# Patient Record
Sex: Female | Born: 1935 | Race: White | Hispanic: No | Marital: Married | Smoking: Never smoker
Health system: Southern US, Community
[De-identification: ages and names within clinical notes are randomized; demographics above are authoritative.]

## PROBLEM LIST (undated history)

## (undated) DIAGNOSIS — E7800 Pure hypercholesterolemia, unspecified: Secondary | ICD-10-CM

## (undated) DIAGNOSIS — F329 Major depressive disorder, single episode, unspecified: Secondary | ICD-10-CM

## (undated) DIAGNOSIS — Z8739 Personal history of other diseases of the musculoskeletal system and connective tissue: Secondary | ICD-10-CM

## (undated) DIAGNOSIS — F419 Anxiety disorder, unspecified: Secondary | ICD-10-CM

## (undated) DIAGNOSIS — R569 Unspecified convulsions: Secondary | ICD-10-CM

## (undated) DIAGNOSIS — F32A Depression, unspecified: Secondary | ICD-10-CM

## (undated) DIAGNOSIS — N390 Urinary tract infection, site not specified: Secondary | ICD-10-CM

## (undated) DIAGNOSIS — I4891 Unspecified atrial fibrillation: Secondary | ICD-10-CM

## (undated) HISTORY — PX: OTHER SURGICAL HISTORY: SHX169

---

## 2004-11-06 ENCOUNTER — Ambulatory Visit: Payer: Self-pay | Admitting: Ophthalmology

## 2004-11-12 ENCOUNTER — Ambulatory Visit: Payer: Self-pay | Admitting: Ophthalmology

## 2006-11-05 ENCOUNTER — Ambulatory Visit: Payer: Self-pay | Admitting: Urology

## 2008-03-19 ENCOUNTER — Ambulatory Visit: Payer: Self-pay | Admitting: Internal Medicine

## 2009-08-10 ENCOUNTER — Emergency Department: Payer: Self-pay | Admitting: Emergency Medicine

## 2009-11-07 ENCOUNTER — Ambulatory Visit: Payer: Self-pay | Admitting: Family Medicine

## 2010-03-14 ENCOUNTER — Ambulatory Visit: Payer: Self-pay | Admitting: Internal Medicine

## 2010-12-16 ENCOUNTER — Ambulatory Visit: Payer: Self-pay | Admitting: Ophthalmology

## 2012-05-31 ENCOUNTER — Ambulatory Visit: Payer: Self-pay | Admitting: Family Medicine

## 2012-07-18 ENCOUNTER — Ambulatory Visit: Payer: Self-pay | Admitting: Neurology

## 2014-09-20 ENCOUNTER — Emergency Department: Payer: Self-pay | Admitting: Emergency Medicine

## 2015-05-12 ENCOUNTER — Encounter: Payer: Self-pay | Admitting: Emergency Medicine

## 2015-05-12 ENCOUNTER — Emergency Department
Admission: EM | Admit: 2015-05-12 | Discharge: 2015-05-12 | Disposition: A | Discharge status: Home / Self Care | Payer: Medicare Other | Attending: Emergency Medicine | Admitting: Emergency Medicine

## 2015-05-12 ENCOUNTER — Emergency Department: Payer: Medicare Other

## 2015-05-12 DIAGNOSIS — G8929 Other chronic pain: Secondary | ICD-10-CM | POA: Diagnosis not present

## 2015-05-12 DIAGNOSIS — W1839XA Other fall on same level, initial encounter: Secondary | ICD-10-CM | POA: Insufficient documentation

## 2015-05-12 DIAGNOSIS — S3992XA Unspecified injury of lower back, initial encounter: Secondary | ICD-10-CM | POA: Insufficient documentation

## 2015-05-12 DIAGNOSIS — S0990XA Unspecified injury of head, initial encounter: Secondary | ICD-10-CM

## 2015-05-12 DIAGNOSIS — S0101XA Laceration without foreign body of scalp, initial encounter: Secondary | ICD-10-CM | POA: Diagnosis not present

## 2015-05-12 DIAGNOSIS — Y9389 Activity, other specified: Secondary | ICD-10-CM | POA: Diagnosis not present

## 2015-05-12 DIAGNOSIS — Y92002 Bathroom of unspecified non-institutional (private) residence single-family (private) house as the place of occurrence of the external cause: Secondary | ICD-10-CM | POA: Diagnosis not present

## 2015-05-12 DIAGNOSIS — Y998 Other external cause status: Secondary | ICD-10-CM | POA: Diagnosis not present

## 2015-05-12 HISTORY — DX: Unspecified convulsions: R56.9

## 2015-05-12 MED ORDER — TRAMADOL HCL 50 MG PO TABS: 50.0000 mg | ORAL_TABLET | Freq: Four times a day (QID) | ORAL | Status: DC | PRN

## 2015-05-12 MED ORDER — TRAMADOL HCL 50 MG PO TABS
50.0000 mg | ORAL_TABLET | Freq: Once | ORAL | Status: AC
  Administered 2015-05-12: 50 mg via ORAL
  Filled 2015-05-12: qty 1

## 2015-05-12 MED ORDER — LIDOCAINE-EPINEPHRINE (PF) 1 %-1:200000 IJ SOLN
INTRAMUSCULAR | Status: AC
  Filled 2015-05-12: qty 30

## 2015-05-12 NOTE — ED Notes (Signed)
Pt alert and oriented X4, active, cooperative, pt in NAD. RR even and unlabored, color WNL.  Pt informed to return if any life threatening symptoms occur.   

## 2015-05-12 NOTE — ED Notes (Signed)
Pt fell in last night in bathroom. Pt cannot recall if she had syncopal episode or mechanical fall. Laceration with bleeding controlled to top right forehead. Pt also c/o left sided rib pain.

## 2015-05-12 NOTE — ED Notes (Signed)
Pt to ED after falling during the night and hitting right side of head, family member states pt fell in the bathroom and hit head on counter, unknown if any LOC, pt has laceration present to right side of head with bleeding controlled

## 2015-05-12 NOTE — ED Provider Notes (Signed)
[facility] Emergency Department Provider Note     Time seen: ----------------------------------------- 5:00 PM on 05/12/2015 -----------------------------------------    I have reviewed the triage vital signs and the nursing notes.   HISTORY  Chief Complaint Fall and Head Injury    HPI Sherry Crosby is a 81 y.o. female who presents to ER after she fell last night in the bathroom. She cannot recall she had a syncopal episode or mechanical fall. She had a laceration above her right eyebrow, was complaining of some chronic diffuse whole-body aches area does not feel like that the fall worsened her chronic chest and back pain. Patient denies any other complaints this time.   Past Medical History  Diagnosis Date  . Seizures (HCC)     There are no active problems to display for this patient.   No past surgical history on file.  Allergies Review of patient's allergies indicates no known allergies.  Social History Social History  Substance Use Topics  . Smoking status: Never Smoker   . Smokeless tobacco: None  . Alcohol Use: No    Review of Systems Constitutional: Negative for fever. Eyes: Negative for visual changes. ENT: Negative for sore throat. Cardiovascular: Negative for chest pain. Respiratory: Negative for shortness of breath. Gastrointestinal: Negative for abdominal pain, vomiting and diarrhea. Genitourinary: Negative for dysuria. Musculoskeletal: Positive for chronic back pain Skin: Positive for laceration Neurological: Positive for headache  10-point ROS otherwise negative.  ____________________________________________   PHYSICAL EXAM:  VITAL SIGNS: ED Triage Vitals  Enc Vitals Group     BP 05/12/15 1646 146/65 mmHg     Pulse Rate 05/12/15 1646 68     Resp 05/12/15 1646 18     Temp 05/12/15 1646 98 F (36.7 C)     Temp Source 05/12/15 1646 Oral     SpO2 05/12/15 1646 100 %     Weight 05/12/15 1646 88 lb (39.917  kg)     Height 05/12/15 1646 5\' 3"  (1.6 m)     Head Cir --      Peak Flow --      Pain Score --      Pain Loc --      Pain Edu? --      Excl. in GC? --     Constitutional: Alert and oriented. Well appearing and in no distress. Eyes: Conjunctivae are normal. PERRL. Normal extraocular movements. ENT   Head: Normocephalic, 3 cm laceration as noted above the right eyebrow that is linear, into the subcutaneous tissue   Nose: No congestion/rhinnorhea.   Mouth/Throat: Mucous membranes are moist.   Neck: No stridor. Cardiovascular: Normal rate, regular rhythm. Normal and symmetric distal pulses are present in all extremities. No murmurs, rubs, or gallops. Respiratory: Normal respiratory effort without tachypnea nor retractions. Breath sounds are clear and equal bilaterally. No wheezes/rales/rhonchi. Musculoskeletal: Nontender with normal range of motion in all extremities. No joint effusions.  No lower extremity tenderness nor edema. Neurologic:  Normal speech and language. No gross focal neurologic deficits are appreciated. Speech is normal.  Skin:  3 cm laceration as noted above the right eyebrow.  ___________________________________________  ED COURSE:  Pertinent labs & imaging results that were available during my care of the patient were reviewed by me and considered in my medical decision making (see chart for details). Patient is in no acute distress, will need suture repair and CT imaging. ____________________________________________   LACERATION REPAIR Performed by: Emily FilbertWilliams, Ivianna Notch E Authorized by: Daryel NovemberWilliams, Canaan Prue E Consent:  Verbal consent obtained. Risks and benefits: risks, benefits and alternatives were discussed Consent given by: patient Patient identity confirmed: provided demographic data Prepped and Draped in normal sterile fashion Wound explored  Laceration Location: Right frontal scalp above the eyebrow  Laceration Length: 3 cm  No Foreign  Bodies seen or palpated  Anesthesia: local infiltration  Local anesthetic: lidocaine 1 % with epinephrine  Anesthetic total: 3 ml  Irrigation method: syringe Amount of cleaning: standard  Skin closure: 6-0 Prolene   Number of sutures: 8   Technique: Running   Patient tolerance: Patient tolerated the procedure well with no immediate complications.  RADIOLOGY Images were viewed by me  CT head is unremarkable  ____________________________________________  FINAL ASSESSMENT AND PLAN  Fall, head injury, scalp laceration  Plan: Patient with labs and imaging as dictated above. Wound was repaired with good cosmetic result. Advise suture removal in 7 days. She stable for outpatient follow-up.   Emily Filbert, MD   Emily Filbert, MD 05/12/15 [phone]

## 2015-05-12 NOTE — Discharge Instructions (Signed)
Head Injury, Adult °You have a head injury. Headaches and throwing up (vomiting) are common after a head injury. It should be easy to wake up from sleeping. Sometimes you must stay in the hospital. Most problems happen within the first 24 hours. Side effects may occur up to 7-10 days after the injury.  °WHAT ARE THE TYPES OF HEAD INJURIES? °Head injuries can be as minor as a bump. Some head injuries can be more severe. More severe head injuries include: °· A jarring injury to the brain (concussion). °· A bruise of the brain (contusion). This mean there is bleeding in the brain that can cause swelling. °· A cracked skull (skull fracture). °· Bleeding in the brain that collects, clots, and forms a bump (hematoma). °WHEN SHOULD I GET HELP RIGHT AWAY?  °· You are confused or sleepy. °· You cannot be woken up. °· You feel sick to your stomach (nauseous) or keep throwing up (vomiting). °· Your dizziness or unsteadiness is getting worse. °· You have very bad, lasting headaches that are not helped by medicine. Take medicines only as told by your doctor. °· You cannot use your arms or legs like normal. °· You cannot walk. °· You notice changes in the black spots in the center of the colored part of your eye (pupil). °· You have clear or bloody fluid coming from your nose or ears. °· You have trouble seeing. °During the next 24 hours after the injury, you must stay with someone who can watch you. This person should get help right away (call 911 in the [place] if you start to shake and are not able to control it (have seizures), you pass out, or you are unable to wake up. °HOW CAN I PREVENT A HEAD INJURY IN THE FUTURE? °· Wear seat belts. °· Wear a helmet while bike riding and playing sports like football. °· Stay away from dangerous activities around the house. °WHEN CAN I RETURN TO NORMAL ACTIVITIES AND ATHLETICS? °See your doctor before doing these activities. You should not do normal activities or play contact sports until 1  week after the following symptoms have stopped: °· Headache that does not go away. °· Dizziness. °· Poor attention. °· Confusion. °· Memory problems. °· Sickness to your stomach or throwing up. °· Tiredness. °· Fussiness. °· Bothered by bright lights or loud noises. °· Anxiousness or depression. °· Restless sleep. °MAKE SURE YOU:  °· Understand these instructions. °· Will watch your condition. °· Will get help right away if you are not doing well or get worse. °  °This information is not intended to replace advice given to you by your health care provider. Make sure you discuss any questions you have with your health care provider. °  °Document Released: 05/28/2008 Document Revised: 07/06/2014 Document Reviewed: 02/20/2013 °Elsevier Interactive Patient Education ©2016 [employer] ° °Laceration Care, Adult °A laceration is a cut that goes through all of the layers of the skin and into the tissue that is right under the skin. Some lacerations heal on their own. Others need to be closed with stitches (sutures), staples, skin adhesive strips, or skin glue. Proper laceration care minimizes the risk of infection and helps the laceration to heal better. °HOW TO CARE FOR YOUR LACERATION °If sutures or staples were used: °· Keep the wound clean and dry. °· If you were given a bandage (dressing), you should change it at least one time per day or as told by your health care provider. You should also   change it if it becomes wet or dirty. °· Keep the wound completely dry for the first 24 hours or as told by your health care provider. After that time, you may shower or bathe. However, make sure that the wound is not soaked in water until after the sutures or staples have been removed. °· Clean the wound one time each day or as told by your health care provider: °¨ Wash the wound with soap and water. °¨ Rinse the wound with water to remove all soap. °¨ Pat the wound dry with a clean towel. Do not rub the wound. °· After cleaning  the wound, apply a thin layer of antibiotic ointment as told by your health care provider. This will help to prevent infection and keep the dressing from sticking to the wound. °· Have the sutures or staples removed as told by your health care provider. °If skin adhesive strips were used: °· Keep the wound clean and dry. °· If you were given a bandage (dressing), you should change it at least one time per day or as told by your health care provider. You should also change it if it becomes dirty or wet. °· Do not get the skin adhesive strips wet. You may shower or bathe, but be careful to keep the wound dry. °· If the wound gets wet, pat it dry with a clean towel. Do not rub the wound. °· Skin adhesive strips fall off on their own. You may trim the strips as the wound heals. Do not remove skin adhesive strips that are still stuck to the wound. They will fall off in time. °If skin glue was used: °· Try to keep the wound dry, but you may briefly wet it in the shower or bath. Do not soak the wound in water, such as by swimming. °· After you have showered or bathed, gently pat the wound dry with a clean towel. Do not rub the wound. °· Do not do any activities that will make you sweat heavily until the skin glue has fallen off on its own. °· Do not apply liquid, cream, or ointment medicine to the wound while the skin glue is in place. Using those may loosen the film before the wound has healed. °· If you were given a bandage (dressing), you should change it at least one time per day or as told by your health care provider. You should also change it if it becomes dirty or wet. °· If a dressing is placed over the wound, be careful not to apply tape directly over the skin glue. Doing that may cause the glue to be pulled off before the wound has healed. °· Do not pick at the glue. The skin glue usually remains in place for 5-10 days, then it falls off of the skin. °General Instructions °· Take over-the-counter and  prescription medicines only as told by your health care provider. °· If you were prescribed an antibiotic medicine or ointment, take or apply it as told by your doctor. Do not stop using it even if your condition improves. °· To help prevent scarring, make sure to cover your wound with sunscreen whenever you are outside after stitches are removed, after adhesive strips are removed, or when glue remains in place and the wound is healed. Make sure to wear a sunscreen of at least 30 SPF. °· Do not scratch or pick at the wound. °· Keep all follow-up visits as told by your health care provider. This is important. °·   Check your wound every day for signs of infection. Watch for: °¨ Redness, swelling, or pain. °¨ Fluid, blood, or pus. °· Raise (elevate) the injured area above the level of your heart while you are sitting or lying down, if possible. °SEEK MEDICAL CARE IF: °· You received a tetanus shot and you have swelling, severe pain, redness, or bleeding at the injection site. °· You have a fever. °· A wound that was closed breaks open. °· You notice a bad smell coming from your wound or your dressing. °· You notice something coming out of the wound, such as wood or glass. °· Your pain is not controlled with medicine. °· You have increased redness, swelling, or pain at the site of your wound. °· You have fluid, blood, or pus coming from your wound. °· You notice a change in the color of your skin near your wound. °· You need to change the dressing frequently due to fluid, blood, or pus draining from the wound. °· You develop a new rash. °· You develop numbness around the wound. °SEEK IMMEDIATE MEDICAL CARE IF: °· You develop severe swelling around the wound. °· Your pain suddenly increases and is severe. °· You develop painful lumps near the wound or on skin that is anywhere on your body. °· You have a red streak going away from your wound. °· The wound is on your hand or foot and you cannot properly move a finger or  toe. °· The wound is on your hand or foot and you notice that your fingers or toes look pale or bluish. °  °This information is not intended to replace advice given to you by your health care provider. Make sure you discuss any questions you have with your health care provider. °  °Document Released: 06/15/2005 Document Revised: 10/30/2014 Document Reviewed: 06/11/2014 °Elsevier Interactive Patient Education ©2016 [employer] ° °

## 2015-07-30 ENCOUNTER — Emergency Department
Admission: EM | Admit: 2015-07-30 | Discharge: 2015-07-30 | Disposition: A | Discharge status: Home / Self Care | Payer: Medicare Other | Attending: Emergency Medicine | Admitting: Emergency Medicine

## 2015-07-30 ENCOUNTER — Encounter: Payer: Self-pay | Admitting: Emergency Medicine

## 2015-07-30 ENCOUNTER — Emergency Department: Payer: Medicare Other

## 2015-07-30 DIAGNOSIS — S0181XA Laceration without foreign body of other part of head, initial encounter: Secondary | ICD-10-CM | POA: Insufficient documentation

## 2015-07-30 DIAGNOSIS — S0101XA Laceration without foreign body of scalp, initial encounter: Secondary | ICD-10-CM | POA: Insufficient documentation

## 2015-07-30 DIAGNOSIS — W19XXXA Unspecified fall, initial encounter: Secondary | ICD-10-CM

## 2015-07-30 DIAGNOSIS — Y9389 Activity, other specified: Secondary | ICD-10-CM | POA: Diagnosis not present

## 2015-07-30 DIAGNOSIS — W1839XA Other fall on same level, initial encounter: Secondary | ICD-10-CM | POA: Diagnosis not present

## 2015-07-30 DIAGNOSIS — Y92009 Unspecified place in unspecified non-institutional (private) residence as the place of occurrence of the external cause: Secondary | ICD-10-CM | POA: Diagnosis not present

## 2015-07-30 DIAGNOSIS — Y998 Other external cause status: Secondary | ICD-10-CM | POA: Diagnosis not present

## 2015-07-30 DIAGNOSIS — R2689 Other abnormalities of gait and mobility: Secondary | ICD-10-CM | POA: Insufficient documentation

## 2015-07-30 DIAGNOSIS — S01111A Laceration without foreign body of right eyelid and periocular area, initial encounter: Secondary | ICD-10-CM | POA: Insufficient documentation

## 2015-07-30 DIAGNOSIS — S0990XA Unspecified injury of head, initial encounter: Secondary | ICD-10-CM | POA: Diagnosis present

## 2015-07-30 MED ORDER — LIDOCAINE-EPINEPHRINE (PF) 1 %-1:200000 IJ SOLN
INTRAMUSCULAR | Status: AC
  Filled 2015-07-30: qty 30

## 2015-07-30 MED ORDER — ACETAMINOPHEN 500 MG PO TABS
1000.0000 mg | ORAL_TABLET | Freq: Once | ORAL | Status: AC
  Administered 2015-07-30: 1000 mg via ORAL
  Filled 2015-07-30: qty 2

## 2015-07-30 NOTE — ED Notes (Signed)
Pt to ed with c/o fall this am in the bathroom at home.  Pt with hematoma and laceration to right forehead.  Near eyebrow.  Pt alert an oriented x 4.  Pt confused about fall and can not verify if she had a loss of consciousness.

## 2015-07-30 NOTE — Discharge Instructions (Signed)
Head Injury, Adult °You have a head injury. Headaches and throwing up (vomiting) are common after a head injury. It should be easy to wake up from sleeping. Sometimes you must stay in the hospital. Most problems happen within the first 24 hours. Side effects may occur up to 7-10 days after the injury.  °WHAT ARE THE TYPES OF HEAD INJURIES? °Head injuries can be as minor as a bump. Some head injuries can be more severe. More severe head injuries include: °· A jarring injury to the brain (concussion). °· A bruise of the brain (contusion). This mean there is bleeding in the brain that can cause swelling. °· A cracked skull (skull fracture). °· Bleeding in the brain that collects, clots, and forms a bump (hematoma). °WHEN SHOULD I GET HELP RIGHT AWAY?  °· You are confused or sleepy. °· You cannot be woken up. °· You feel sick to your stomach (nauseous) or keep throwing up (vomiting). °· Your dizziness or unsteadiness is getting worse. °· You have very bad, lasting headaches that are not helped by medicine. Take medicines only as told by your doctor. °· You cannot use your arms or legs like normal. °· You cannot walk. °· You notice changes in the black spots in the center of the colored part of your eye (pupil). °· You have clear or bloody fluid coming from your nose or ears. °· You have trouble seeing. °During the next 24 hours after the injury, you must stay with someone who can watch you. This person should get help right away (call 911 in the [place] if you start to shake and are not able to control it (have seizures), you pass out, or you are unable to wake up. °HOW CAN I PREVENT A HEAD INJURY IN THE FUTURE? °· Wear seat belts. °· Wear a helmet while bike riding and playing sports like football. °· Stay away from dangerous activities around the house. °WHEN CAN I RETURN TO NORMAL ACTIVITIES AND ATHLETICS? °See your doctor before doing these activities. You should not do normal activities or play contact sports until 1  week after the following symptoms have stopped: °· Headache that does not go away. °· Dizziness. °· Poor attention. °· Confusion. °· Memory problems. °· Sickness to your stomach or throwing up. °· Tiredness. °· Fussiness. °· Bothered by bright lights or loud noises. °· Anxiousness or depression. °· Restless sleep. °MAKE SURE YOU:  °· Understand these instructions. °· Will watch your condition. °· Will get help right away if you are not doing well or get worse. °  °This information is not intended to replace advice given to you by your health care provider. Make sure you discuss any questions you have with your health care provider. °  °Document Released: 05/28/2008 Document Revised: 07/06/2014 Document Reviewed: 02/20/2013 °Elsevier Interactive Patient Education ©2016 [employer] ° °Laceration Care, Adult °A laceration is a cut that goes through all layers of the skin. The cut also goes into the tissue that is right under the skin. Some cuts heal on their own. Others need to be closed with stitches (sutures), staples, skin adhesive strips, or wound glue. Taking care of your cut lowers your risk of infection and helps your cut to heal better. °HOW TO TAKE CARE OF YOUR CUT °For stitches or staples: °· Keep the wound clean and dry. °· If you were given a bandage (dressing), you should change it at least one time per day or as told by your doctor. You should also   change it if it gets wet or dirty. °· Keep the wound completely dry for the first 24 hours or as told by your doctor. After that time, you may take a shower or a bath. However, make sure that the wound is not soaked in water until after the stitches or staples have been removed. °· Clean the wound one time each day or as told by your doctor: °¨ Wash the wound with soap and water. °¨ Rinse the wound with water until all of the soap comes off. °¨ Pat the wound dry with a clean towel. Do not rub the wound. °· After you clean the wound, put a thin layer of  antibiotic ointment on it as told by your doctor. This ointment: °¨ Helps to prevent infection. °¨ Keeps the bandage from sticking to the wound. °· Have your stitches or staples removed as told by your doctor. °If your doctor used skin adhesive strips:  °· Keep the wound clean and dry. °· If you were given a bandage, you should change it at least one time per day or as told by your doctor. You should also change it if it gets dirty or wet. °· Do not get the skin adhesive strips wet. You can take a shower or a bath, but be careful to keep the wound dry. °· If the wound gets wet, pat it dry with a clean towel. Do not rub the wound. °· Skin adhesive strips fall off on their own. You can trim the strips as the wound heals. Do not remove any strips that are still stuck to the wound. They will fall off after a while. °If your doctor used wound glue: °· Try to keep your wound dry, but you may briefly wet it in the shower or bath. Do not soak the wound in water, such as by swimming. °· After you take a shower or a bath, gently pat the wound dry with a clean towel. Do not rub the wound. °· Do not do any activities that will make you really sweaty until the skin glue has fallen off on its own. °· Do not apply liquid, cream, or ointment medicine to your wound while the skin glue is still on. °· If you were given a bandage, you should change it at least one time per day or as told by your doctor. You should also change it if it gets dirty or wet. °· If a bandage is placed over the wound, do not let the tape for the bandage touch the skin glue. °· Do not pick at the glue. The skin glue usually stays on for 5-10 days. Then, it falls off of the skin. °General Instructions  °· To help prevent scarring, make sure to cover your wound with sunscreen whenever you are outside after stitches are removed, after adhesive strips are removed, or when wound glue stays in place and the wound is healed. Make sure to wear a sunscreen of at least  30 SPF. °· Take over-the-counter and prescription medicines only as told by your doctor. °· If you were given antibiotic medicine or ointment, take or apply it as told by your doctor. Do not stop using the antibiotic even if your wound is getting better. °· Do not scratch or pick at the wound. °· Keep all follow-up visits as told by your doctor. This is important. °· Check your wound every day for signs of infection. Watch for: °¨ Redness, swelling, or pain. °¨ Fluid, blood, or pus. °·   Raise (elevate) the injured area above the level of your heart while you are sitting or lying down, if possible. °GET HELP IF: °· You got a tetanus shot and you have any of these problems at the injection site: °¨ Swelling. °¨ Very bad pain. °¨ Redness. °¨ Bleeding. °· You have a fever. °· A wound that was closed breaks open. °· You notice a bad smell coming from your wound or your bandage. °· You notice something coming out of the wound, such as wood or glass. °· Medicine does not help your pain. °· You have more redness, swelling, or pain at the site of your wound. °· You have fluid, blood, or pus coming from your wound. °· You notice a change in the color of your skin near your wound. °· You need to change the bandage often because fluid, blood, or pus is coming from the wound. °· You start to have a new rash. °· You start to have numbness around the wound. °GET HELP RIGHT AWAY IF: °· You have very bad swelling around the wound. °· Your pain suddenly gets worse and is very bad. °· You notice painful lumps near the wound or on skin that is anywhere on your body. °· You have a red streak going away from your wound. °· The wound is on your hand or foot and you cannot move a finger or toe like you usually can. °· The wound is on your hand or foot and you notice that your fingers or toes look pale or bluish. °  °This information is not intended to replace advice given to you by your health care provider. Make sure you discuss any  questions you have with your health care provider. °  °Document Released: 12/02/2007 Document Revised: 10/30/2014 Document Reviewed: 06/11/2014 °Elsevier Interactive Patient Education ©2016 [employer] ° °

## 2015-07-30 NOTE — ED Provider Notes (Signed)
[facility] Emergency Department Provider Note     Time seen: ----------------------------------------- 9:11 AM on 07/30/2015 -----------------------------------------    I have reviewed the triage vital signs and the nursing notes.   HISTORY  Chief Complaint Fall and Head Injury    HPI Sherry Crosby is a 82 y.o. female who presents ER after a fall in the bathroom at home. Patient has an history of falls and has been unsteady on her feet particularly when she wakes up in the morning. This is not a new problem for her, was noted to have a hematoma and laceration right forehead. She describes pain around this area otherwise denies complaints.   Past Medical History  Diagnosis Date  . Seizures (HCC)     There are no active problems to display for this patient.   History reviewed. No pertinent past surgical history.  Allergies Review of patient's allergies indicates no known allergies.  Social History Social History  Substance Use Topics  . Smoking status: Never Smoker   . Smokeless tobacco: None  . Alcohol Use: No    Review of Systems Constitutional: Negative for fever. Eyes: Negative for visual changes. ENT: Negative for sore throat. Cardiovascular: Negative for chest pain. Respiratory: Negative for shortness of breath. Gastrointestinal: Negative for abdominal pain, vomiting and diarrhea. Genitourinary: Negative for dysuria. Musculoskeletal: Negative for back pain. Skin: Negative for rash. Neurological: Positive for headache, chronic gait disturbance  10-point ROS otherwise negative.  ____________________________________________   PHYSICAL EXAM:  VITAL SIGNS: ED Triage Vitals  Enc Vitals Group     BP --      Pulse --      Resp --      Temp --      Temp src --      SpO2 --      Weight 07/30/15 0845 88 lb (39.917 kg)     Height --      Head Cir --      Peak Flow --      Pain Score 07/30/15 0845 4     Pain Loc --    Pain Edu? --      Excl. in GC? --     Constitutional: Alert and oriented. Well appearing and in no distress. Eyes: Conjunctivae are normal. PERRL. Normal extraocular movements. ENT   Head: 3 cm laceration noted over the right periorbital aspect laterally   Nose: No congestion/rhinnorhea.   Mouth/Throat: Mucous membranes are moist.   Neck: No stridor. Cardiovascular: Normal rate, regular rhythm. Normal and symmetric distal pulses are present in all extremities. No murmurs, rubs, or gallops. Respiratory: Normal respiratory effort without tachypnea nor retractions. Breath sounds are clear and equal bilaterally. No wheezes/rales/rhonchi. Gastrointestinal: Soft and nontender. No distention. No abdominal bruits.  Musculoskeletal: Nontender with normal range of motion in all extremities. No joint effusions.  No lower extremity tenderness nor edema. Neurologic:  Normal speech and language. No gross focal neurologic deficits are appreciated. Speech is normal. No gait instability. Skin:  Skin is warm, dry and intact. No rash noted. Psychiatric: Mood and affect are normal. Speech and behavior are normal. Patient exhibits appropriate insight and judgment. ____________________________________________  EKG: Interpreted by me. Sinus rhythm with a rate of 74 bpm, normal PR interval, normal QRS, long QT interval. Normal axis, flat ST and T-wave segments throughout.  ____________________________________________  ED COURSE:  Pertinent labs & imaging results that were available during my care of the patient were reviewed by me and considered in my medical  decision making (see chart for details).  LACERATION REPAIR Performed by: Emily Filbert Authorized by: Daryel November E Consent: Verbal consent obtained. Risks and benefits: risks, benefits and alternatives were discussed Consent given by: patient Patient identity confirmed: provided demographic data Prepped and Draped in  normal sterile fashion Wound explored  Laceration Location: Right periorbital area  Laceration Length: 3 cm  No Foreign Bodies seen or palpated  Anesthesia: local infiltration  Local anesthetic: lidocaine 1 % with epinephrine  Anesthetic total: 4 ml  Irrigation method: syringe Amount of cleaning: standard  Skin closure: 5-0 Prolene   Number of sutures: 7   Technique: Running   Patient tolerance: Patient tolerated the procedure well with no immediate complications. ____________________________________________   RADIOLOGY  CT head IMPRESSION: Atrophy with small vessel chronic ischemic changes of deep cerebral white matter.  No acute intracranial abnormalities. ____________________________________________  FINAL ASSESSMENT AND PLAN  Fall, head injury, scalp laceration  Plan: Patient with labs and imaging as dictated above. Wound was repaired as dictated above with good cosmetic and hemostatic closure. She is in no acute distress, he is ambulating here without any further complaints. She is stable for outpatient follow-up.   Emily Filbert, MD   Emily Filbert, MD 07/30/15 [phone]

## 2015-07-30 NOTE — ED Notes (Signed)
Patient given instructions on using her walker, how to stand and sit to prevent falls, removing any rugs in the home and picking feet up when walking and taking time when moving.  Patient verbalized understanding.

## 2016-03-10 ENCOUNTER — Emergency Department: Payer: Medicare Other

## 2016-03-10 ENCOUNTER — Inpatient Hospital Stay
Admission: EM | Admit: 2016-03-10 | Discharge: 2016-03-20 | DRG: 308 | Disposition: A | Discharge status: Skilled Nursing Facility | Payer: Medicare Other | Attending: Internal Medicine | Admitting: Internal Medicine

## 2016-03-10 ENCOUNTER — Encounter: Payer: Self-pay | Admitting: Emergency Medicine

## 2016-03-10 DIAGNOSIS — Z823 Family history of stroke: Secondary | ICD-10-CM | POA: Diagnosis not present

## 2016-03-10 DIAGNOSIS — G40909 Epilepsy, unspecified, not intractable, without status epilepticus: Secondary | ICD-10-CM | POA: Diagnosis present

## 2016-03-10 DIAGNOSIS — R251 Tremor, unspecified: Secondary | ICD-10-CM | POA: Diagnosis present

## 2016-03-10 DIAGNOSIS — E860 Dehydration: Secondary | ICD-10-CM | POA: Diagnosis present

## 2016-03-10 DIAGNOSIS — D696 Thrombocytopenia, unspecified: Secondary | ICD-10-CM | POA: Diagnosis present

## 2016-03-10 DIAGNOSIS — I480 Paroxysmal atrial fibrillation: Secondary | ICD-10-CM | POA: Diagnosis not present

## 2016-03-10 DIAGNOSIS — N898 Other specified noninflammatory disorders of vagina: Secondary | ICD-10-CM | POA: Diagnosis present

## 2016-03-10 DIAGNOSIS — M85879 Other specified disorders of bone density and structure, unspecified ankle and foot: Secondary | ICD-10-CM | POA: Diagnosis present

## 2016-03-10 DIAGNOSIS — R0902 Hypoxemia: Secondary | ICD-10-CM | POA: Diagnosis present

## 2016-03-10 DIAGNOSIS — F419 Anxiety disorder, unspecified: Secondary | ICD-10-CM | POA: Diagnosis present

## 2016-03-10 DIAGNOSIS — I495 Sick sinus syndrome: Secondary | ICD-10-CM | POA: Diagnosis present

## 2016-03-10 DIAGNOSIS — Z79899 Other long term (current) drug therapy: Secondary | ICD-10-CM

## 2016-03-10 DIAGNOSIS — I5031 Acute diastolic (congestive) heart failure: Secondary | ICD-10-CM | POA: Diagnosis present

## 2016-03-10 DIAGNOSIS — Z87440 Personal history of urinary (tract) infections: Secondary | ICD-10-CM | POA: Diagnosis not present

## 2016-03-10 DIAGNOSIS — Z7982 Long term (current) use of aspirin: Secondary | ICD-10-CM | POA: Diagnosis not present

## 2016-03-10 DIAGNOSIS — I4891 Unspecified atrial fibrillation: Secondary | ICD-10-CM | POA: Diagnosis present

## 2016-03-10 DIAGNOSIS — F329 Major depressive disorder, single episode, unspecified: Secondary | ICD-10-CM | POA: Diagnosis present

## 2016-03-10 DIAGNOSIS — E7800 Pure hypercholesterolemia, unspecified: Secondary | ICD-10-CM | POA: Diagnosis present

## 2016-03-10 DIAGNOSIS — Z833 Family history of diabetes mellitus: Secondary | ICD-10-CM

## 2016-03-10 DIAGNOSIS — I110 Hypertensive heart disease with heart failure: Secondary | ICD-10-CM | POA: Diagnosis present

## 2016-03-10 DIAGNOSIS — M2012 Hallux valgus (acquired), left foot: Secondary | ICD-10-CM | POA: Diagnosis present

## 2016-03-10 DIAGNOSIS — N3090 Cystitis, unspecified without hematuria: Secondary | ICD-10-CM

## 2016-03-10 DIAGNOSIS — Z8249 Family history of ischemic heart disease and other diseases of the circulatory system: Secondary | ICD-10-CM | POA: Diagnosis not present

## 2016-03-10 DIAGNOSIS — M79672 Pain in left foot: Secondary | ICD-10-CM

## 2016-03-10 DIAGNOSIS — W19XXXA Unspecified fall, initial encounter: Secondary | ICD-10-CM

## 2016-03-10 HISTORY — DX: Anxiety disorder, unspecified: F41.9

## 2016-03-10 HISTORY — DX: Pure hypercholesterolemia, unspecified: E78.00

## 2016-03-10 HISTORY — DX: Unspecified atrial fibrillation: I48.91

## 2016-03-10 HISTORY — DX: Depression, unspecified: F32.A

## 2016-03-10 HISTORY — DX: Personal history of other diseases of the musculoskeletal system and connective tissue: Z87.39

## 2016-03-10 HISTORY — DX: Major depressive disorder, single episode, unspecified: F32.9

## 2016-03-10 HISTORY — DX: Urinary tract infection, site not specified: N39.0

## 2016-03-10 LAB — BASIC METABOLIC PANEL
Anion gap: 6 (ref 5–15)
BUN: 23 mg/dL — ABNORMAL HIGH (ref 6–20)
CALCIUM: 10.4 mg/dL — AB (ref 8.9–10.3)
CO2: 26 mmol/L (ref 22–32)
CREATININE: 1.09 mg/dL — AB (ref 0.44–1.00)
Chloride: 105 mmol/L (ref 101–111)
GFR calc non Af Amer: 45 mL/min — ABNORMAL LOW (ref >60)
GFR, EST AFRICAN AMERICAN: 52 mL/min — AB (ref >60)
Glucose, Bld: 104 mg/dL — ABNORMAL HIGH (ref 65–99)
Potassium: 4.1 mmol/L (ref 3.5–5.1)
SODIUM: 137 mmol/L (ref 135–145)

## 2016-03-10 LAB — MAGNESIUM
Magnesium: 1.9 mg/dL (ref 1.7–2.4)
Magnesium: 1.8 mg/dL (ref 1.7–2.4)

## 2016-03-10 LAB — URINALYSIS COMPLETE WITH MICROSCOPIC (ARMC ONLY)
Bilirubin Urine: NEGATIVE
GLUCOSE, UA: NEGATIVE mg/dL
Nitrite: NEGATIVE
PROTEIN: NEGATIVE mg/dL
Specific Gravity, Urine: 1.01 (ref 1.005–1.030)
pH: 7 (ref 5.0–8.0)

## 2016-03-10 LAB — CBC
HCT: 40.7 % (ref 35.0–47.0)
Hemoglobin: 13.9 g/dL (ref 12.0–16.0)
MCH: 33.2 pg (ref 26.0–34.0)
MCHC: 34.1 g/dL (ref 32.0–36.0)
MCV: 97.4 fL (ref 80.0–100.0)
PLATELETS: 131 10*3/uL — AB (ref 150–440)
RBC: 4.17 MIL/uL (ref 3.80–5.20)
RDW: 13.9 % (ref 11.5–14.5)
WBC: 6.9 10*3/uL (ref 3.6–11.0)

## 2016-03-10 LAB — TSH: TSH: 0.94 u[IU]/mL (ref 0.350–4.500)

## 2016-03-10 LAB — PHOSPHORUS: PHOSPHORUS: 3 mg/dL (ref 2.5–4.6)

## 2016-03-10 LAB — TROPONIN I: Troponin I: <0.03 ng/mL (ref <0.03)

## 2016-03-10 MED ORDER — ENOXAPARIN SODIUM 60 MG/0.6ML ~~LOC~~ SOLN
1.0000 mg/kg | SUBCUTANEOUS | Status: DC
  Administered 2016-03-10: 45 mg via SUBCUTANEOUS
  Filled 2016-03-10: qty 0.6

## 2016-03-10 MED ORDER — ASPIRIN EC 81 MG PO TBEC
81.0000 mg | DELAYED_RELEASE_TABLET | Freq: Every day | ORAL | Status: DC
  Administered 2016-03-11 – 2016-03-20 (×10): 81 mg via ORAL
  Filled 2016-03-10 (×10): qty 1

## 2016-03-10 MED ORDER — ACETAMINOPHEN 650 MG RE SUPP: 650.0000 mg | Freq: Four times a day (QID) | RECTAL | Status: DC | PRN

## 2016-03-10 MED ORDER — SODIUM CHLORIDE 0.9% FLUSH
3.0000 mL | Freq: Two times a day (BID) | INTRAVENOUS | Status: DC
  Administered 2016-03-11 – 2016-03-20 (×9): 3 mL via INTRAVENOUS

## 2016-03-10 MED ORDER — ALBUTEROL SULFATE (2.5 MG/3ML) 0.083% IN NEBU: 2.5000 mg | INHALATION_SOLUTION | RESPIRATORY_TRACT | Status: DC | PRN

## 2016-03-10 MED ORDER — ZONISAMIDE 100 MG PO CAPS
100.0000 mg | ORAL_CAPSULE | Freq: Every day | ORAL | Status: DC
  Administered 2016-03-11: 100 mg via ORAL
  Filled 2016-03-10: qty 1

## 2016-03-10 MED ORDER — ONDANSETRON HCL 4 MG/2ML IJ SOLN: 4.0000 mg | Freq: Four times a day (QID) | INTRAMUSCULAR | Status: DC | PRN

## 2016-03-10 MED ORDER — GABAPENTIN 100 MG PO CAPS
100.0000 mg | ORAL_CAPSULE | Freq: Every day | ORAL | Status: DC
  Administered 2016-03-11 – 2016-03-20 (×10): 100 mg via ORAL
  Filled 2016-03-10 (×10): qty 1

## 2016-03-10 MED ORDER — TRIMETHOPRIM 100 MG PO TABS
100.0000 mg | ORAL_TABLET | Freq: Every day | ORAL | Status: DC
  Administered 2016-03-11 – 2016-03-16 (×6): 100 mg via ORAL
  Filled 2016-03-10 (×6): qty 1

## 2016-03-10 MED ORDER — CITALOPRAM HYDROBROMIDE 20 MG PO TABS
20.0000 mg | ORAL_TABLET | Freq: Every day | ORAL | Status: DC
  Administered 2016-03-11 – 2016-03-16 (×6): 20 mg via ORAL
  Filled 2016-03-10 (×6): qty 1

## 2016-03-10 MED ORDER — DILTIAZEM HCL 25 MG/5ML IV SOLN
10.0000 mg | Freq: Once | INTRAVENOUS | Status: AC
  Administered 2016-03-10: 10 mg via INTRAVENOUS
  Filled 2016-03-10: qty 5

## 2016-03-10 MED ORDER — DILTIAZEM HCL 100 MG IV SOLR
5.0000 mg/h | Freq: Once | INTRAVENOUS | Status: AC
  Administered 2016-03-10: 5 mg/h via INTRAVENOUS
  Filled 2016-03-10: qty 100

## 2016-03-10 MED ORDER — LEVETIRACETAM 500 MG PO TABS
500.0000 mg | ORAL_TABLET | Freq: Two times a day (BID) | ORAL | Status: DC
Start: 2016-03-10 — End: 2016-03-20
  Administered 2016-03-10 – 2016-03-20 (×20): 500 mg via ORAL
  Filled 2016-03-10 (×20): qty 1

## 2016-03-10 MED ORDER — DIVALPROEX SODIUM 250 MG PO DR TAB
375.0000 mg | DELAYED_RELEASE_TABLET | Freq: Two times a day (BID) | ORAL | Status: DC
  Administered 2016-03-10 – 2016-03-20 (×20): 375 mg via ORAL
  Filled 2016-03-10 (×22): qty 1

## 2016-03-10 MED ORDER — SODIUM CHLORIDE 0.9 % IV SOLN: 250.0000 mL | INTRAVENOUS | Status: DC | PRN

## 2016-03-10 MED ORDER — ACETAMINOPHEN 325 MG PO TABS
650.0000 mg | ORAL_TABLET | Freq: Four times a day (QID) | ORAL | Status: DC | PRN
  Administered 2016-03-18 – 2016-03-19 (×2): 650 mg via ORAL
  Filled 2016-03-10 (×2): qty 2

## 2016-03-10 MED ORDER — SODIUM CHLORIDE 0.9% FLUSH
3.0000 mL | Freq: Two times a day (BID) | INTRAVENOUS | Status: DC
  Administered 2016-03-10 – 2016-03-19 (×16): 3 mL via INTRAVENOUS

## 2016-03-10 MED ORDER — SODIUM CHLORIDE 0.9% FLUSH: 3.0000 mL | INTRAVENOUS | Status: DC | PRN

## 2016-03-10 MED ORDER — DILTIAZEM HCL 100 MG IV SOLR
5.0000 mg/h | INTRAVENOUS | Status: DC
  Administered 2016-03-10: 12.5 mg/h via INTRAVENOUS
  Administered 2016-03-10 – 2016-03-11 (×3): 10 mg/h via INTRAVENOUS
  Filled 2016-03-10 (×2): qty 100

## 2016-03-10 MED ORDER — ONDANSETRON HCL 4 MG PO TABS: 4.0000 mg | ORAL_TABLET | Freq: Four times a day (QID) | ORAL | Status: DC | PRN

## 2016-03-10 MED ORDER — SODIUM CHLORIDE 0.9 % IV SOLN
INTRAVENOUS | Status: DC
  Administered 2016-03-10: 23:00:00 via INTRAVENOUS

## 2016-03-10 MED ORDER — DEXTROSE 5 % IV SOLN
1.0000 g | INTRAVENOUS | Status: DC
  Administered 2016-03-10: 1 g via INTRAVENOUS
  Filled 2016-03-10 (×2): qty 10

## 2016-03-10 NOTE — Progress Notes (Signed)
ANTICOAGULATION CONSULT NOTE - Initial Consult  Pharmacy Consult for enoxaparin  Indication: atrial fibrillation  No Known Allergies  Patient Measurements: Height: 5\' 1"  (154.9 cm) Weight: 95 lb (43.1 kg) IBW/kg (Calculated) : 47.8  Vital Signs: Temp: 98.2 F (36.8 C) (09/12 1545) Temp Source: Oral (09/12 1545) BP: 120/67 (09/12 1955) Pulse Rate: 83 (09/12 1955)  Labs:  Recent Labs  03/10/16 1552  HGB 13.9  HCT 40.7  PLT 131*  CREATININE 1.09*  TROPONINI <0.03    Estimated Creatinine Clearance: 25.7 mL/min (by C-G formula based on SCr of 1.09 mg/dL).   Medical History: Past Medical History:  Diagnosis Date  . A-fib (HCC)   . Anxiety   . Depression   . High cholesterol   . Hx of degenerative disc disease   . Recurrent UTI   . Seizures [facility](HCC)    Assessment: 82 yo female admitted with A. Fib. Pharmacy consulted for enoxaparin dosing.   Plt: 131   Hgb: 13.9    Plan:  Based on patient crcl <630ml/min will start patient on Enoxaparin 45mg  every 24 hours.  Will continue to monitor renal function.   Cher NakaiSheema Ligia Duguay, PharmD Clinical Pharmacist 03/10/2016 8:29 PM

## 2016-03-10 NOTE — ED Provider Notes (Signed)
[facility] Emergency Department Provider Note  ____________________________________________  Time seen: Approximately 5:05 PM  I have reviewed the triage vital signs and the nursing notes.   HISTORY  Chief Complaint Tachycardia  Level 5 caveat:  Portions of the history and physical were unable to be obtained due to the patient's poor historian    HPI Sherry Crosby is a 82 y.o. female brought to the ED from clinic due to A. fib with RVR. Patient was seen in her primary care clinic today for a routine scheduled appointment when they did an EKG and noticed that she had a very fast heart rate. She sent here for further evaluation. The patient denies any complaints except for feeling generalized weakness over the past couple of weeks. She has no other acute complaints. Unable to state whether she has been eating and drinking sufficiently or not. Reports that she is compliant with her medications.     Past Medical History:  Diagnosis Date  . A-fib (HCC)   . Anxiety   . Depression   . High cholesterol   . Hx of degenerative disc disease   . Recurrent UTI   . Seizures [facility](HCC)      Patient Active Problem List   Diagnosis Date Noted  . A-fib (HCC) 03/10/2016     No past surgical history on file.   Prior to Admission medications   Medication Sig Start Date End Date Taking? Authorizing Provider  aspirin EC 81 MG tablet Take 1 tablet by mouth daily. 07/04/15 07/03/16 Yes Historical Provider, MD  calcium-vitamin D (OSCAL WITH D) 500-200 MG-UNIT tablet Take 1 tablet by mouth daily.   Yes Historical Provider, MD  citalopram (CELEXA) 20 MG tablet Take 1 tablet by mouth daily. 05/03/15  Yes Historical Provider, MD  Cyanocobalamin (VITAMIN B-12 CR) 1000 MCG TBCR Take 1 tablet by mouth daily.   Yes Historical Provider, MD  divalproex (DEPAKOTE) 250 MG DR tablet Take 375 mg by mouth 2 (two) times daily.  05/03/15  Yes Historical Provider, MD  estradiol (ESTRACE  VAGINAL) 0.1 MG/GM vaginal cream Place 0.5 application vaginally 2 (two) times a week. 09/30/09  Yes Historical Provider, MD  gabapentin (NEURONTIN) 100 MG capsule Take 1 capsule by mouth daily. 05/06/15  Yes Historical Provider, MD  levETIRAcetam (KEPPRA) 500 MG tablet Take 1 tablet by mouth 2 (two) times daily. 05/06/15  Yes Historical Provider, MD  trimethoprim (TRIMPEX) 100 MG tablet Take 1 tablet by mouth daily. 06/18/15  Yes Historical Provider, MD  zonisamide (ZONEGRAN) 100 MG capsule Take 1 capsule by mouth daily. 05/03/15   Historical Provider, MD     Allergies Review of patient's allergies indicates no known allergies.   Family History  Problem Relation Age of Onset  . Diabetes Mother   . Hypertension Mother   . Stroke Mother   . Heart disease Father     Social History Social History  Substance Use Topics  . Smoking status: Never Smoker  . Smokeless tobacco: Never Used  . Alcohol use No    Review of Systems  Constitutional:   No fever or chills.  ENT:   No sore throat. No rhinorrhea. Cardiovascular:   No chest pain. Respiratory:   No dyspnea or cough. Gastrointestinal:   Negative for abdominal pain, vomiting and diarrhea.  Genitourinary:   Negative for dysuria or difficulty urinating. Neurological:   Negative for dizziness 10-point ROS otherwise negative.  ____________________________________________   PHYSICAL EXAM:  VITAL SIGNS: ED Triage Vitals  Enc Vitals Group     BP 03/10/16 1545 125/64     Pulse Rate 03/10/16 1545 (!) 144     Resp 03/10/16 1545 16     Temp 03/10/16 1545 98.2 F (36.8 C)     Temp Source 03/10/16 1545 Oral     SpO2 03/10/16 1545 98 %     Weight 03/10/16 1541 95 lb (43.1 kg)     Height 03/10/16 1541 5\' 1"  (1.549 m)     Head Circumference --      Peak Flow --      Pain Score 03/10/16 1541 0     Pain Loc --      Pain Edu? --      Excl. in GC? --     Vital signs reviewed, nursing assessments reviewed.   Constitutional:   Alert  and oriented. Well appearing and in no distress. Eyes:   No scleral icterus. No conjunctival pallor. PERRL. EOMI.  No nystagmus. ENT   Head:   Normocephalic and atraumatic.   Nose:   No congestion/rhinnorhea. No septal hematoma   Mouth/Throat:   Dry mucous membranes, no pharyngeal erythema. No peritonsillar mass.    Neck:   No stridor. No SubQ emphysema. No meningismus. Hematological/Lymphatic/Immunilogical:   No cervical lymphadenopathy. Cardiovascular:   Irregularly irregular rhythm, heart rate 1:30. Symmetric bilateral radial and DP pulses.  No murmurs.  Respiratory:   Normal respiratory effort without tachypnea nor retractions. Breath sounds are clear and equal bilaterally. No wheezes/rales/rhonchi. Gastrointestinal:   Soft and nontender. Non distended. There is no CVA tenderness.  No rebound, rigidity, or guarding. Genitourinary:   deferred Musculoskeletal:   Nontender with normal range of motion in all extremities. No joint effusions.  No lower extremity tenderness.  No edema. Neurologic:   Normal speech and language.  CN 2-10 normal. Motor grossly intact. No gross focal neurologic deficits are appreciated.  Skin:    Skin is warm, dry and intact. No rash noted.  No petechiae, purpura, or bullae.  ____________________________________________    LABS (pertinent positives/negatives) (all labs ordered are listed, but only abnormal results are displayed) Labs Reviewed  BASIC METABOLIC PANEL - Abnormal; Notable for the following:       Result Value   Glucose, Bld 104 (*)    BUN 23 (*)    Creatinine, Ser 1.09 (*)    Calcium 10.4 (*)    GFR calc non Af Amer 45 (*)    GFR calc Af Amer 52 (*)    All other components within normal limits  CBC - Abnormal; Notable for the following:    Platelets 131 (*)    All other components within normal limits  URINALYSIS COMPLETEWITH MICROSCOPIC ([facility] ONLY) - Abnormal; Notable for the following:    Color, Urine STRAW (*)     APPearance CLEAR (*)    Ketones, ur TRACE (*)    Hgb urine dipstick 2+ (*)    Leukocytes, UA 2+ (*)    Bacteria, UA RARE (*)    Squamous Epithelial / LPF 0-5 (*)    All other components within normal limits  MAGNESIUM  PHOSPHORUS  TSH  TROPONIN I   ____________________________________________   EKG  Interpreted by me Atrial fibrillation with rapid ventricular response, heart rate 141. Normal axis and intervals. Poor R-wave progression in anterior precordial leads. Normal ST segments and T waves.  ____________________________________________    RADIOLOGY  Chest x-ray with vascular congestion and mild pulmonary edema.  ____________________________________________   PROCEDURES Procedures  CRITICAL CARE Performed by: Scotty Court, Lajoya Dombek   Total critical care time: 35 minutes  Critical care time was exclusive of separately billable procedures and treating other patients.  Critical care was necessary to treat or prevent imminent or life-threatening deterioration.  Critical care was time spent personally by me on the following activities: development of treatment plan with patient and/or surrogate as well as nursing, discussions with consultants, evaluation of patient's response to treatment, examination of patient, obtaining history from patient or surrogate, ordering and performing treatments and interventions, ordering and review of laboratory studies, ordering and review of radiographic studies, pulse oximetry and re-evaluation of patient's condition.  ____________________________________________   INITIAL IMPRESSION / ASSESSMENT AND PLAN / ED COURSE  Pertinent labs & imaging results that were available during my care of the patient were reviewed by me and considered in my medical decision making (see chart for details).  Patient presents with A. fib with RVR. Check labs chest x-ray, IV diltiazem for rate control. We'll continue to  monitor.  ----------------------------------------- 5:08 PM on 03/10/2016 ----------------------------------------- Initially obtained rate control with heart rate of about 100 after 10 mg IV diltiazem. However, heart rate is gradually but continuously decelerating again, currently in the 120s. We'll start on diltiazem drip and plan for admission, especially with evidence of impending congestive heart failure..      Clinical Course   ____________________________________________   FINAL CLINICAL IMPRESSION(S) / ED DIAGNOSES  Final diagnoses:  Atrial fibrillation with RVR (HCC)  Atrial fibrillation with rapid ventricular response     Portions of this note were generated with dragon dictation software. Dictation errors may occur despite best attempts at proofreading.    Sharman Cheek, MD 03/10/16 [phone]

## 2016-03-10 NOTE — H&P (Signed)
[employer] [facility] - [place] [facility]   PATIENT NAME: Sherry Crosby    MR#:  [id number]  DATE OF BIRTH:  [birth year]  DATE OF ADMISSION:  03/10/2016  PRIMARY CARE PHYSICIAN: Lamount CrankerINGEL, SARAH CORNWELL, MD   REQUESTING/REFERRING PHYSICIAN: Sharman CheekPhillip Stafford, MD  CHIEF COMPLAINT:   Chief Complaint  Patient presents with  . Tachycardia   Tachycardia today. HISTORY OF PRESENT ILLNESS:  Sherry HoardOzetta Bielefeld  is a 82 y.o. female with a known history of A. fib, hypertension, hyperlipidemia, seizure disorder and recurrent UTI. The patient is not a good historian. Per her son, the patient was sent by primary care physician due to A. fib with RVR noticed in office. The patient has no complaints except generalized weakness for the past 2 weeks. She was treated with Cardizem IV without improvement and started Cardizem drip. Heart rate is still around 130.  PAST MEDICAL HISTORY:   Past Medical History:  Diagnosis Date  . A-fib (HCC)   . Anxiety   . Depression   . High cholesterol   . Hx of degenerative disc disease   . Recurrent UTI   . Seizures (HCC)     PAST SURGICAL HISTORY:   Past Surgical History:  Procedure Laterality Date  . cataract surgery    . Tonsillectomy      SOCIAL HISTORY:   Social History  Substance Use Topics  . Smoking status: Never Smoker  . Smokeless tobacco: Never Used  . Alcohol use No    FAMILY HISTORY:   Family History  Problem Relation Age of Onset  . Diabetes Mother   . Hypertension Mother   . Stroke Mother   . Heart disease Father     DRUG ALLERGIES:  No Known Allergies  REVIEW OF SYSTEMS:   Review of Systems  Constitutional: Positive for malaise/fatigue. Negative for chills and fever.  HENT: Negative for congestion and hearing loss.   Eyes: Negative for blurred vision and double vision.  Respiratory: Negative for cough, wheezing and stridor.   Cardiovascular: Negative for chest pain and palpitations.  Gastrointestinal:  Negative for abdominal pain, blood in stool, diarrhea, melena, nausea and vomiting.  Genitourinary: Negative for dysuria, frequency and urgency.  Musculoskeletal: Negative for joint pain.  Skin: Negative for itching and rash.  Neurological: Positive for weakness. Negative for dizziness, tingling, focal weakness, seizures, loss of consciousness and headaches.    MEDICATIONS AT HOME:   Prior to Admission medications   Medication Sig Start Date End Date Taking? Authorizing Provider  aspirin EC 81 MG tablet Take 1 tablet by mouth daily. 07/04/15 07/03/16 Yes Historical Provider, MD  calcium-vitamin D (OSCAL WITH D) 500-200 MG-UNIT tablet Take 1 tablet by mouth daily.   Yes Historical Provider, MD  citalopram (CELEXA) 20 MG tablet Take 1 tablet by mouth daily. 05/03/15  Yes Historical Provider, MD  Cyanocobalamin (VITAMIN B-12 CR) 1000 MCG TBCR Take 1 tablet by mouth daily.   Yes Historical Provider, MD  divalproex (DEPAKOTE) 250 MG DR tablet Take 375 mg by mouth 2 (two) times daily.  05/03/15  Yes Historical Provider, MD  estradiol (ESTRACE VAGINAL) 0.1 MG/GM vaginal cream Place 0.5 application vaginally 2 (two) times a week. 09/30/09  Yes Historical Provider, MD  gabapentin (NEURONTIN) 100 MG capsule Take 1 capsule by mouth daily. 05/06/15  Yes Historical Provider, MD  levETIRAcetam (KEPPRA) 500 MG tablet Take 1 tablet by mouth 2 (two) times daily. 05/06/15  Yes Historical Provider, MD  trimethoprim (TRIMPEX) 100 MG tablet Take 1  tablet by mouth daily. 06/18/15  Yes Historical Provider, MD  zonisamide (ZONEGRAN) 100 MG capsule Take 1 capsule by mouth daily. 05/03/15   Historical Provider, MD      VITAL SIGNS:  Blood pressure 134/73, pulse (!) 127, temperature 98.2 F (36.8 C), temperature source Oral, resp. rate 14, height 5\' 1"  (1.549 m), weight 95 lb (43.1 kg), SpO2 96 %.  PHYSICAL EXAMINATION:  Physical Exam  GENERAL:  82 y.o.-year-old patient lying in the bed with no acute distress. But looks  fragile. EYES: Pupils equal, round, reactive to light and accommodation. No scleral icterus. Extraocular muscles intact.  HEENT: Head atraumatic, normocephalic. Oropharynx and nasopharynx clear.  NECK:  Supple, no jugular venous distention. No thyroid enlargement, no tenderness.  LUNGS: Normal breath sounds bilaterally, no wheezing, rales,rhonchi or crepitation. No use of accessory muscles of respiration.  CARDIOVASCULAR: S1, S2 normal. No murmurs, rubs, or gallops.  ABDOMEN: Soft, nontender, nondistended. Bowel sounds present. No organomegaly or mass.  EXTREMITIES: No pedal edema, cyanosis, or clubbing.  NEUROLOGIC: Cranial nerves II through XII are intact. Muscle strength 4/5 in all extremities. Sensation intact. Gait not checked.  PSYCHIATRIC: The patient is alert and oriented x 2.  SKIN: No obvious rash, lesion, or ulcer.   LABORATORY PANEL:   CBC  Recent Labs Lab 03/10/16 1552  WBC 6.9  HGB 13.9  HCT 40.7  PLT 131*   ------------------------------------------------------------------------------------------------------------------  Chemistries   Recent Labs Lab 03/10/16 1552  NA 137  K 4.1  CL 105  CO2 26  GLUCOSE 104*  BUN 23*  CREATININE 1.09*  CALCIUM 10.4*  MG 1.9   ------------------------------------------------------------------------------------------------------------------  Cardiac Enzymes  Recent Labs Lab 03/10/16 1552  TROPONINI <0.03   ------------------------------------------------------------------------------------------------------------------  RADIOLOGY:  Dg Chest 2 View  Result Date: 03/10/2016 CLINICAL DATA:  Atrial fibrillation, recurrent urinary tract infection EXAM: CHEST  2 VIEW COMPARISON:  None. FINDINGS: There is mild cardiomegaly present with probable small effusions and mild pulmonary vascular congestion suggesting mild CHF. No focal pneumonia is seen. No bony abnormality is seen other than diffuse osteopenia. IMPRESSION:  Possible mild CHF with cardiomegaly and small effusions with questionable mild pulmonary vascular congestion. Electronically Signed   By: Dwyane Dee M.D.   On: 03/10/2016 16:41      IMPRESSION AND PLAN:   Afib RVR The patient will be admitted to telemetry floor, continue Cardizem drip, start Lovenox pharmacy to dose, get echocardiograph and cardiology consult.  UTI. Follow-up urine culture and start Rocephin.  Dehydration. Give gentle rehydration and follow-up BMP. Hold IV fluid if the patient has any signs of fluid overload.  All the records are reviewed and case discussed with ED provider. Management plans discussed with the patient, Her son (POA)and they are in agreement.  CODE STATUS: Full code.  TOTAL TIME TAKING CARE OF THIS PATIENT: 58 minutes.    Shaune Pollack M.D on 03/10/2016 at 6:53 PM  Between 7am to 6pm - Pager - [phone]  After 6pm go to [email or web address] - Social research officer, government  [facility] [place] [facility]  Office  [phone]  CC: Primary care physician; Lamount Cranker, MD   Note: This dictation was prepared with Dragon dictation along with smaller phrase technology. Any transcriptional errors that result from this process are unintentional.

## 2016-03-10 NOTE — ED Notes (Signed)
Pt. returned from XR. 

## 2016-03-10 NOTE — ED Triage Notes (Signed)
[facility] [place] for [facility] due to Afib with RVR today.   Patient denies all complaint.

## 2016-03-10 NOTE — ED Notes (Signed)
Minerva Areolaric, son, left phone number to be contacted:   [phone]

## 2016-03-10 NOTE — ED Notes (Signed)
Patient transported to X-ray 

## 2016-03-11 ENCOUNTER — Inpatient Hospital Stay (HOSPITAL_COMMUNITY)
Admit: 2016-03-11 | Discharge: 2016-03-11 | Disposition: A | Discharge status: Home / Self Care | Payer: Medicare Other | Attending: Internal Medicine | Admitting: Internal Medicine

## 2016-03-11 ENCOUNTER — Inpatient Hospital Stay: Payer: Medicare Other

## 2016-03-11 DIAGNOSIS — I4891 Unspecified atrial fibrillation: Secondary | ICD-10-CM

## 2016-03-11 LAB — CBC
HCT: 34 % — ABNORMAL LOW (ref 35.0–47.0)
Hemoglobin: 11.8 g/dL — ABNORMAL LOW (ref 12.0–16.0)
MCH: 33.5 pg (ref 26.0–34.0)
MCHC: 34.6 g/dL (ref 32.0–36.0)
MCV: 96.8 fL (ref 80.0–100.0)
PLATELETS: 107 10*3/uL — AB (ref 150–440)
RBC: 3.51 MIL/uL — ABNORMAL LOW (ref 3.80–5.20)
RDW: 13.5 % (ref 11.5–14.5)
WBC: 7.1 10*3/uL (ref 3.6–11.0)

## 2016-03-11 LAB — BASIC METABOLIC PANEL
Anion gap: 6 (ref 5–15)
BUN: 16 mg/dL (ref 6–20)
CO2: 24 mmol/L (ref 22–32)
Calcium: 9.1 mg/dL (ref 8.9–10.3)
Chloride: 107 mmol/L (ref 101–111)
Creatinine, Ser: 0.74 mg/dL (ref 0.44–1.00)
GFR calc Af Amer: >60 mL/min (ref >60)
GLUCOSE: 106 mg/dL — AB (ref 65–99)
Potassium: 3.9 mmol/L (ref 3.5–5.1)
SODIUM: 137 mmol/L (ref 135–145)

## 2016-03-11 LAB — ECHOCARDIOGRAM COMPLETE
Height: 61 in
Weight: 1520 oz

## 2016-03-11 LAB — TROPONIN I: Troponin I: <0.03 ng/mL (ref <0.03)

## 2016-03-11 MED ORDER — AMIODARONE HCL IN DEXTROSE 360-4.14 MG/200ML-% IV SOLN
30.0000 mg/h | INTRAVENOUS | Status: DC
  Administered 2016-03-11 – 2016-03-13 (×4): 30 mg/h via INTRAVENOUS
  Filled 2016-03-11 (×4): qty 200

## 2016-03-11 MED ORDER — DIGOXIN 125 MCG PO TABS
0.1250 mg | ORAL_TABLET | Freq: Every day | ORAL | Status: DC
  Administered 2016-03-11: 0.125 mg via ORAL
  Filled 2016-03-11: qty 1

## 2016-03-11 MED ORDER — AMIODARONE LOAD VIA INFUSION
150.0000 mg | Freq: Once | INTRAVENOUS | Status: AC
  Administered 2016-03-11: 150 mg via INTRAVENOUS
  Filled 2016-03-11: qty 83.34

## 2016-03-11 MED ORDER — ENOXAPARIN SODIUM 40 MG/0.4ML ~~LOC~~ SOLN
1.0000 mg/kg | Freq: Two times a day (BID) | SUBCUTANEOUS | Status: DC
  Administered 2016-03-11 – 2016-03-14 (×8): 40 mg via SUBCUTANEOUS
  Filled 2016-03-11 (×8): qty 0.4

## 2016-03-11 MED ORDER — MAGNESIUM OXIDE 400 (241.3 MG) MG PO TABS
400.0000 mg | ORAL_TABLET | Freq: Two times a day (BID) | ORAL | Status: AC
  Administered 2016-03-11 – 2016-03-12 (×4): 400 mg via ORAL
  Filled 2016-03-11 (×4): qty 1

## 2016-03-11 MED ORDER — AMIODARONE HCL IN DEXTROSE 360-4.14 MG/200ML-% IV SOLN
60.0000 mg/h | INTRAVENOUS | Status: AC
  Administered 2016-03-11: 60 mg/h via INTRAVENOUS
  Filled 2016-03-11: qty 200

## 2016-03-11 NOTE — Progress Notes (Signed)
Cardizem drip discontinued and amiodarone bolus given and drip started at this time.HEART RATE IS 121 and in atrial fibrillation.close monitoring in progress.

## 2016-03-11 NOTE — Progress Notes (Signed)
[facility] [place] [facility]                                                                                                                                                                                            Patient Demographics   Sherry Crosby, is a 82 y.o. female, DOB - Jan 31, 1929, [id number]  Admit date - 03/10/2016   Admitting Physician Shaune PollackQing Chen, MD  Outpatient Primary MD for the patient is RINGEL, Dillard EssexSARAH CORNWELL, MD   LOS - 1  Subjective: Patient with a history of atrial fibrillation admitted with atrial fibrillation with rapid ventricular rate She is still feeling very weak Complains of pain in the left foot    Review of Systems:   CONSTITUTIONAL: No documented fever. No fatigue, Positive weakness. No weight gain, no weight loss.  EYES: No blurry or double vision.  ENT: No tinnitus. No postnasal drip. No redness of the oropharynx.  RESPIRATORY: No cough, no wheeze, no hemoptysis. No dyspnea.  CARDIOVASCULAR: No chest pain. No orthopnea. No palpitations. No syncope.  GASTROINTESTINAL: No nausea, no vomiting or diarrhea. No abdominal pain. No melena or hematochezia.  GENITOURINARY: No dysuria or hematuria.  ENDOCRINE: No polyuria or nocturia. No heat or cold intolerance.  HEMATOLOGY: No anemia. No bruising. No bleeding.  INTEGUMENTARY: No rashes. No lesions.  MUSCULOSKELETAL: No arthritis. No swelling. No gout. Left foot pain NEUROLOGIC: No numbness, tingling, or ataxia. No seizure-type activity.  PSYCHIATRIC: No anxiety. No insomnia. No ADD.    Vitals:   Vitals:   03/11/16 1000 03/11/16 1030 03/11/16 1055 03/11/16 1100  BP: (!) 97/52 (!) 89/59 (!) 103/46 (!) 105/59  Pulse: (!) 101 (!) 121 75 (!) 126  Resp:      Temp:      TempSrc:      SpO2:      Weight:      Height:        Wt Readings from Last 3 Encounters:  03/10/16 95 lb (43.1 kg)  07/30/15 88 lb (39.9 kg)  05/12/15 88 lb (39.9 kg)     Intake/Output Summary  (Last 24 hours) at 03/11/16 1219 Last data filed at 03/11/16 1037  Gross per 24 hour  Intake              650 ml  Output                0 ml  Net              650 ml    Physical Exam:   GENERAL: Pleasant-appearing in no apparent distress.  HEAD, EYES, EARS, NOSE  AND THROAT: Atraumatic, normocephalic. Extraocular muscles are intact. Pupils equal and reactive to light. Sclerae anicteric. No conjunctival injection. No oro-pharyngeal erythema.  NECK: Supple. There is no jugular venous distention. No bruits, no lymphadenopathy, no thyromegaly.  HEART: Irregularly irregular No murmurs, no rubs, no clicks.  LUNGS: Clear to auscultation bilaterally. No rales or rhonchi. No wheezes.  ABDOMEN: Soft, flat, nontender, nondistended. Has good bowel sounds. No hepatosplenomegaly appreciated.  EXTREMITIES: No evidence of any cyanosis, clubbing, or peripheral edema.  +2 pedal and radial pulses bilaterally left foot with some bruising.  NEUROLOGIC: The patient is alert, awake, and oriented x3 with no focal motor or sensory deficits appreciated bilaterally.  SKIN: Moist and warm with no rashes appreciated.  Psych: Not anxious, depressed LN: No inguinal LN enlargement    Antibiotics   Anti-infectives    Start     Dose/Rate Route Frequency Ordered Stop   03/11/16 1000  trimethoprim (TRIMPEX) tablet 100 mg     100 mg Oral Daily 03/10/16 2019     03/10/16 2200  cefTRIAXone (ROCEPHIN) 1 g in dextrose 5 % 50 mL IVPB     1 g 100 mL/hr over 30 Minutes Intravenous Every 24 hours 03/10/16 2019        Medications   Scheduled Meds: . aspirin EC  81 mg Oral Daily  . cefTRIAXone (ROCEPHIN)  IV  1 g Intravenous Q24H  . citalopram  20 mg Oral Daily  . digoxin  0.125 mg Oral Daily  . divalproex  375 mg Oral BID  . enoxaparin (LOVENOX) injection  1 mg/kg (Order-Specific) Subcutaneous BID  . gabapentin  100 mg Oral Daily  . levETIRAcetam  500 mg Oral BID  . sodium chloride flush  3 mL Intravenous Q12H  .  sodium chloride flush  3 mL Intravenous Q12H  . trimethoprim  100 mg Oral Daily  . zonisamide  100 mg Oral Daily   Continuous Infusions: . diltiazem (CARDIZEM) infusion 10 mg/hr (03/11/16 1207)   PRN Meds:.sodium chloride, acetaminophen **OR** acetaminophen, albuterol, ondansetron **OR** ondansetron (ZOFRAN) IV, sodium chloride flush   Data Review:   Micro Results No results found for this or any previous visit (from the past 240 hour(s)).  Radiology Reports Dg Chest 2 View  Result Date: 03/10/2016 CLINICAL DATA:  Atrial fibrillation, recurrent urinary tract infection EXAM: CHEST  2 VIEW COMPARISON:  None. FINDINGS: There is mild cardiomegaly present with probable small effusions and mild pulmonary vascular congestion suggesting mild CHF. No focal pneumonia is seen. No bony abnormality is seen other than diffuse osteopenia. IMPRESSION: Possible mild CHF with cardiomegaly and small effusions with questionable mild pulmonary vascular congestion. Electronically Signed   By: Dwyane Dee M.D.   On: 03/10/2016 16:41   Dg Foot 2 Views Left  Result Date: 03/11/2016 CLINICAL DATA:  Fall.  Bruising and left foot pain. EXAM: LEFT FOOT - 2 VIEW COMPARISON:  None available. FINDINGS: Moderate osteopenia is present. No acute bone or soft tissue abnormality is present. A severe hallux valgus deformity is noted. Prominent enthesophytes are present at the second metatarsal IMPRESSION: 1. Severe hallux valgus deformity. 2. Osteopenia. 3. No acute bone or soft tissue abnormality. Electronically Signed   By: Marin Roberts M.D.   On: 03/11/2016 11:38     CBC  Recent Labs Lab 03/10/16 1552 03/11/16 0224  WBC 6.9 7.1  HGB 13.9 11.8*  HCT 40.7 34.0*  PLT 131* 107*  MCV 97.4 96.8  MCH 33.2 33.5  MCHC 34.1 34.6  RDW  13.9 13.5    Chemistries   Recent Labs Lab 03/10/16 1552 03/10/16 2212 03/11/16 0224  NA 137  --  137  K 4.1  --  3.9  CL 105  --  107  CO2 26  --  24  GLUCOSE 104*  --   106*  BUN 23*  --  16  CREATININE 1.09*  --  0.74  CALCIUM 10.4*  --  9.1  MG 1.9 1.8  --    ------------------------------------------------------------------------------------------------------------------ estimated creatinine clearance is 35 mL/min (by C-G formula based on SCr of 0.74 mg/dL). ------------------------------------------------------------------------------------------------------------------ No results for input(s): HGBA1C in the last 72 hours. ------------------------------------------------------------------------------------------------------------------ No results for input(s): CHOL, HDL, LDLCALC, TRIG, CHOLHDL, LDLDIRECT in the last 72 hours. ------------------------------------------------------------------------------------------------------------------  Recent Labs  03/10/16 1552  TSH 0.940   ------------------------------------------------------------------------------------------------------------------ No results for input(s): VITAMINB12, FOLATE, FERRITIN, TIBC, IRON, RETICCTPCT in the last 72 hours.  Coagulation profile No results for input(s): INR, PROTIME in the last 168 hours.  No results for input(s): DDIMER in the last 72 hours.  Cardiac Enzymes  Recent Labs Lab 03/10/16 2212 03/11/16 0224 03/11/16 0933  TROPONINI <0.03 <0.03 <0.03   ------------------------------------------------------------------------------------------------------------------ Invalid input(s): POCBNP    Assessment & Plan  Patient is a 82 year old admitted with generalized weakness noted to have A. fib with RVR  #1 Atrial fibrillation with RVR Heart rate is still poorly controlled Cardiology consult pending Continue IV Cardizem  #2 possible cystitis Discontinue ceftriaxone Start oral Ceftin  #3 acute diastolic CHF I will stop her IV fluids If her blood pressure allows we can use Lasix  #4 history of seizures continue Keppra and Neurontin and Depakote  #5  left foot pain and will obtain x-ray of her foot  #6 depression and anxiety continue Celexa       Code Status Orders        Start     Ordered   03/10/16 2020  Full code  Continuous     03/10/16 2019    Code Status History    Date Active Date Inactive Code Status Order ID Comments User Context   03/10/2016  8:19 PM 03/11/2016  7:53 AM Full Code [id number]  Shaune Pollack, MD Inpatient    Advance Directive Documentation   Flowsheet Row Most Recent Value  Type of Advance Directive  Living will  Pre-existing out of facility DNR order (yellow form or pink MOST form)  No data  "MOST" Form in Place?  No data           Consultscards  DVT Prophylaxis Lovenox monitor with low platelets  Lab Results  Component Value Date   PLT 107 (L) 03/11/2016     Time Spent in minutes  35 minutesreater than 50% of time spent in care coordination and counseling patient regarding the condition and plan of care.   Auburn Bilberry M.D on 03/11/2016 at 12:19 PM  Between 7am to 6pm - Pager - [phone]  After 6pm go to [email or web address] - password [email or web address] [facility]  [facility]   Office  [phone]

## 2016-03-11 NOTE — Care Management (Signed)
I checked on Sherry Crosby and she said she would like some crackers at 2pm. I asked her Nurse and he said she had no restrictions and could have crackers at 2pm if she desires.

## 2016-03-11 NOTE — Consult Note (Addendum)
Reason for Consult: Atrial fibrillation rapid ventricular response Referring Physician: Dr. Lenna Gilford primary, Dr. Bridgett Larsson hospitalist  Sherry Crosby is an 82 y.o. female.  HPI: Patient was seen in the office by body found to be rapid atrial fibrillation the patient had not been feeling well for several days. Patient was found to be rapid atrial fibrillation after discussion with the family they wanted her admitted for rate control and rhythm control. Patient lives alone and has a seizure disorder and uncomfortable treating as an outpatient. Patient complains of palpitations tachycardia but denied any chest pain or shortness of breath. The patient has history of multiple urinary tract infections.  Past Medical History:  Diagnosis Date  . A-fib ([place])   . Anxiety   . Depression   . High cholesterol   . Hx of degenerative disc disease   . Recurrent UTI   . Seizures ([place])     Past Surgical History:  Procedure Laterality Date  . cataract surgery    . Tonsillectomy      Family History  Problem Relation Age of Onset  . Diabetes Mother   . Hypertension Mother   . Stroke Mother   . Heart disease Father     Social History:  reports that she has never smoked. She has never used smokeless tobacco. She reports that she does not drink alcohol or use drugs.  Allergies: No Known Allergies  Medications: I have reviewed the patient's current medications.  Results for orders placed or performed during the hospital encounter of 03/10/16 (from the past 48 hour(s))  Basic metabolic panel     Status: Abnormal   Collection Time: 03/10/16  3:52 PM  Result Value Ref Range   Sodium 137 135 - 145 mmol/L   Potassium 4.1 3.5 - 5.1 mmol/L   Chloride 105 101 - 111 mmol/L   CO2 26 22 - 32 mmol/L   Glucose, Bld 104 (H) 65 - 99 mg/dL   BUN 23 (H) 6 - 20 mg/dL   Creatinine, Ser 1.09 (H) 0.44 - 1.00 mg/dL   Calcium 10.4 (H) 8.9 - 10.3 mg/dL   GFR calc non Af Amer 45 (L) >60 mL/min   GFR calc Af Amer 52 (L)  >60 mL/min    Comment: (NOTE) The eGFR has been calculated using the CKD EPI equation. This calculation has not been validated in all clinical situations. eGFR's persistently <60 mL/min signify possible Chronic Kidney Disease.    Anion gap 6 5 - 15  CBC     Status: Abnormal   Collection Time: 03/10/16  3:52 PM  Result Value Ref Range   WBC 6.9 3.6 - 11.0 K/uL   RBC 4.17 3.80 - 5.20 MIL/uL   Hemoglobin 13.9 12.0 - 16.0 g/dL   HCT 40.7 35.0 - 47.0 %   MCV 97.4 80.0 - 100.0 fL   MCH 33.2 26.0 - 34.0 pg   MCHC 34.1 32.0 - 36.0 g/dL   RDW 13.9 11.5 - 14.5 %   Platelets 131 (L) 150 - 440 K/uL  Magnesium     Status: None   Collection Time: 03/10/16  3:52 PM  Result Value Ref Range   Magnesium 1.9 1.7 - 2.4 mg/dL  Phosphorus     Status: None   Collection Time: 03/10/16  3:52 PM  Result Value Ref Range   Phosphorus 3.0 2.5 - 4.6 mg/dL  TSH     Status: None   Collection Time: 03/10/16  3:52 PM  Result Value Ref Range  TSH 0.940 0.350 - 4.500 uIU/mL  Troponin I     Status: None   Collection Time: 03/10/16  3:52 PM  Result Value Ref Range   Troponin I <0.03 <0.03 ng/mL  Urinalysis complete, with microscopic ([facility] only)     Status: Abnormal   Collection Time: 03/10/16  3:53 PM  Result Value Ref Range   Color, Urine STRAW (A) YELLOW   APPearance CLEAR (A) CLEAR   Glucose, UA NEGATIVE NEGATIVE mg/dL   Bilirubin Urine NEGATIVE NEGATIVE   Ketones, ur TRACE (A) NEGATIVE mg/dL   Specific Gravity, Urine 1.010 1.005 - 1.030   Hgb urine dipstick 2+ (A) NEGATIVE   pH 7.0 5.0 - 8.0   Protein, ur NEGATIVE NEGATIVE mg/dL   Nitrite NEGATIVE NEGATIVE   Leukocytes, UA 2+ (A) NEGATIVE   RBC / HPF 6-30 0 - 5 RBC/hpf   WBC, UA 6-30 0 - 5 WBC/hpf   Bacteria, UA RARE (A) NONE SEEN   Squamous Epithelial / LPF 0-5 (A) NONE SEEN   WBC Clumps PRESENT   Magnesium     Status: None   Collection Time: 03/10/16 10:12 PM  Result Value Ref Range   Magnesium 1.8 1.7 - 2.4 mg/dL  Troponin I     Status:  None   Collection Time: 03/10/16 10:12 PM  Result Value Ref Range   Troponin I <0.03 <0.03 ng/mL  Basic metabolic panel     Status: Abnormal   Collection Time: 03/11/16  2:24 AM  Result Value Ref Range   Sodium 137 135 - 145 mmol/L   Potassium 3.9 3.5 - 5.1 mmol/L   Chloride 107 101 - 111 mmol/L   CO2 24 22 - 32 mmol/L   Glucose, Bld 106 (H) 65 - 99 mg/dL   BUN 16 6 - 20 mg/dL   Creatinine, Ser 0.74 0.44 - 1.00 mg/dL   Calcium 9.1 8.9 - 10.3 mg/dL   GFR calc non Af Amer >60 >60 mL/min   GFR calc Af Amer >60 >60 mL/min    Comment: (NOTE) The eGFR has been calculated using the CKD EPI equation. This calculation has not been validated in all clinical situations. eGFR's persistently <60 mL/min signify possible Chronic Kidney Disease.    Anion gap 6 5 - 15  CBC     Status: Abnormal   Collection Time: 03/11/16  2:24 AM  Result Value Ref Range   WBC 7.1 3.6 - 11.0 K/uL   RBC 3.51 (L) 3.80 - 5.20 MIL/uL   Hemoglobin 11.8 (L) 12.0 - 16.0 g/dL   HCT 34.0 (L) 35.0 - 47.0 %   MCV 96.8 80.0 - 100.0 fL   MCH 33.5 26.0 - 34.0 pg   MCHC 34.6 32.0 - 36.0 g/dL   RDW 13.5 11.5 - 14.5 %   Platelets 107 (L) 150 - 440 K/uL  Troponin I     Status: None   Collection Time: 03/11/16  2:24 AM  Result Value Ref Range   Troponin I <0.03 <0.03 ng/mL  Troponin I     Status: None   Collection Time: 03/11/16  9:33 AM  Result Value Ref Range   Troponin I <0.03 <0.03 ng/mL    Dg Chest 2 View  Result Date: 03/10/2016 CLINICAL DATA:  Atrial fibrillation, recurrent urinary tract infection EXAM: CHEST  2 VIEW COMPARISON:  None. FINDINGS: There is mild cardiomegaly present with probable small effusions and mild pulmonary vascular congestion suggesting mild CHF. No focal pneumonia is seen. No bony abnormality is  seen other than diffuse osteopenia. IMPRESSION: Possible mild CHF with cardiomegaly and small effusions with questionable mild pulmonary vascular congestion. Electronically Signed   By: Ivar Drape  M.D.   On: 03/10/2016 16:41   Dg Foot 2 Views Left  Result Date: 03/11/2016 CLINICAL DATA:  Fall.  Bruising and left foot pain. EXAM: LEFT FOOT - 2 VIEW COMPARISON:  None available. FINDINGS: Moderate osteopenia is present. No acute bone or soft tissue abnormality is present. A severe hallux valgus deformity is noted. Prominent enthesophytes are present at the second metatarsal IMPRESSION: 1. Severe hallux valgus deformity. 2. Osteopenia. 3. No acute bone or soft tissue abnormality. Electronically Signed   By: San Morelle M.D.   On: 03/11/2016 11:38    Review of Systems  Constitutional: Positive for malaise/fatigue.  HENT: Negative.   Eyes: Negative.   Respiratory: Positive for shortness of breath.   Cardiovascular: Positive for orthopnea.  Gastrointestinal: Negative.   Genitourinary: Negative.   Skin: Negative.   Neurological: Positive for dizziness, seizures and weakness.  Endo/Heme/Allergies: Negative.   Psychiatric/Behavioral: Negative.    Blood pressure (!) 110/55, pulse (!) 119, temperature 98 F (36.7 C), temperature source Oral, resp. rate 18, height _0  (1.549 m), weight 43.1 kg (95 lb), SpO2 97 %. Physical Exam  Nursing note and vitals reviewed. Constitutional: She is oriented to person, place, and time. She appears well-developed and well-nourished.  HENT:  Head: Normocephalic and atraumatic.  Eyes: Conjunctivae and EOM are normal. Pupils are equal, round, and reactive to light.  Neck: Normal range of motion. Neck supple.  Cardiovascular: Normal pulses.  An irregularly irregular rhythm present. Tachycardia present.   Murmur heard.  Systolic murmur is present with a grade of 2/6  Respiratory: Effort normal and breath sounds normal.  GI: Soft. Bowel sounds are normal.  Musculoskeletal: Normal range of motion.  Neurological: She is alert and oriented to person, place, and time. She has normal reflexes.  Skin: Skin is warm and dry.  Psychiatric: She has a normal  mood and affect.    Assessment/Plan: Atrial fibrillation rapid ventricular response Tachycardia Seizure disorder Depression Hyperlipidemia UTI DJD . PLAN Agree with admission to telemetry GERD with Cardizem for rate control Recommend start amiodarone drip for rhythm control Poor candidate for anticoagulation because of seizures Agree with broad-spectrum antibiotic therapy for possible UTI Hopefully we'll be able to wean Cardizem once amiodarone was started Continue low-dose digoxin for rate control Recommend medical therapy at this stage Do not recommend cardioversion since the patient is not anticoagulated   CALLWOOD,DWAYNE D. 03/11/2016, 5:26 PM

## 2016-03-11 NOTE — Care Management (Signed)
Sent to the ED from PCP office when found to be in atrial fib.  Required cardizem drip.  Per patient's daughter in law, Janett Billowita, patient has been on a functional decline over the last couple of weeks.  She has a walker, which she does not always use.  She has been stumbling and actually fell yesterday, injuring her left foot.  No fracture seen on xray.  Currently trying to wean cardizem drip.  Requested physical therapy consult when patient is medically stable

## 2016-03-11 NOTE — Progress Notes (Signed)
ANTICOAGULATION CONSULT NOTE - Initial Consult  Pharmacy Consult for enoxaparin  Indication: atrial fibrillation  No Known Allergies  Patient Measurements: Height: 5\' 1"  (154.9 cm) Weight: 95 lb (43.1 kg) IBW/kg (Calculated) : 47.8  Vital Signs: Temp: 98.6 F (37 C) (09/13 0755) Temp Source: Oral (09/13 0755) BP: 105/59 (09/13 1100) Pulse Rate: 126 (09/13 1100)  Labs:  Recent Labs  03/10/16 1552 03/10/16 2212 03/11/16 0224 03/11/16 0933  HGB 13.9  --  11.8*  --   HCT 40.7  --  34.0*  --   PLT 131*  --  107*  --   CREATININE 1.09*  --  0.74  --   TROPONINI <0.03 <0.03 <0.03 <0.03    Estimated Creatinine Clearance: 35 mL/min (by C-G formula based on SCr of 0.74 mg/dL).   Medical History: Past Medical History:  Diagnosis Date  . A-fib ([facility])   . Anxiety   . Depression   . High cholesterol   . Hx of degenerative disc disease   . Recurrent UTI   . Seizures [facility](HCC)    Assessment: 82 yo female admitted with A. Fib. Pharmacy consulted for enoxaparin dosing.   9/12: Plt: 131   Hgb: 13.9   Scr: 1.09 9/13: Plt: 107   Hgb: 11.8   Scr: 0.74    Plan:  Patient's serum creatinine increased to 2635ml/min will increase Enoxaparin 40mg  every 12 hours.  Will continue to monitor renal function.   Rella LarveKelly Onnika Siebel , PharmD Clinical Pharmacist 03/11/2016 11:23 AM

## 2016-03-11 NOTE — Progress Notes (Signed)
*  PRELIMINARY RESULTS* Echocardiogram 2D Echocardiogram has been performed.  Cristela BlueHege, Marilouise Densmore 03/11/2016, 8:51 AM

## 2016-03-12 LAB — CBC
HEMATOCRIT: 33 % — AB (ref 35.0–47.0)
Hemoglobin: 11.3 g/dL — ABNORMAL LOW (ref 12.0–16.0)
MCH: 33 pg (ref 26.0–34.0)
MCHC: 34.3 g/dL (ref 32.0–36.0)
MCV: 96 fL (ref 80.0–100.0)
Platelets: 93 10*3/uL — ABNORMAL LOW (ref 150–440)
RBC: 3.43 MIL/uL — ABNORMAL LOW (ref 3.80–5.20)
RDW: 13.7 % (ref 11.5–14.5)
WBC: 5.2 10*3/uL (ref 3.6–11.0)

## 2016-03-12 LAB — CREATININE, SERUM: Creatinine, Ser: 0.85 mg/dL (ref 0.44–1.00)

## 2016-03-12 LAB — URINE CULTURE

## 2016-03-12 NOTE — Progress Notes (Signed)
Notified by central tele patient converted to normal sinus around 1 AM.

## 2016-03-12 NOTE — Progress Notes (Signed)
Pt has continued on amiodarone drip with no concerns. Rate changed to 16.7 ml/h at 22:45.

## 2016-03-12 NOTE — Progress Notes (Signed)
PT Cancellation Note  Patient Details Name: Sherry Crosby MRN: [id number] DOB: September 17, 1928   Cancelled Treatment:    Reason Eval/Treat Not Completed: Medical issues which prohibited therapy.  Nursing asked to wait due to Amiodarone drip and not being permitted to move pt on the medication.  Will try again in the AM.   Ivar DrapeStout, Chijioke Lasser E 03/12/2016, 2:43 PM    Samul Dadauth Truc Winfree, PT MS Acute Rehab Dept. Number: [facility] [phone] and [facility] [phone]

## 2016-03-12 NOTE — Progress Notes (Signed)
Spoke with dr. Juliann Parescallwood to make aware patient has converted back to normal sinus. Heart rate ranging from 60-70. Per md hold digoxin and continue iv amiodarone. Will continue to monitor closely

## 2016-03-12 NOTE — Progress Notes (Signed)
[facility] - [place] at [facility]                                                                                                                                                                                            Patient Demographics   Sherry Crosby, is a 82 y.o. female, DOB - 09-20-1928, ZOX:[id number]  Admit date - 03/10/2016   Admitting Physician Shaune Pollack, MD  Outpatient Primary MD for the patient is RINGEL, Dillard Essex, MD   LOS - 2  Subjective: Patient with a history of atrial fibrillation admitted with atrial fibrillation with rapid ventricular rate  still weak, on amiodarone drip, back to NSR. Review of Systems:   CONSTITUTIONAL: No documented fever. No fatigue, Positive weakness. No weight gain, no weight loss.  EYES: No blurry or double vision.  ENT: No tinnitus. No postnasal drip. No redness of the oropharynx.  RESPIRATORY: No cough, no wheeze, no hemoptysis. No dyspnea.  CARDIOVASCULAR: No chest pain. No orthopnea. No palpitations. No syncope.  GASTROINTESTINAL: No nausea, no vomiting or diarrhea. No abdominal pain. No melena or hematochezia.  GENITOURINARY: No dysuria or hematuria.  ENDOCRINE: No polyuria or nocturia. No heat or cold intolerance.  HEMATOLOGY: No anemia. No bruising. No bleeding.  INTEGUMENTARY: No rashes. No lesions.  MUSCULOSKELETAL: No arthritis. No swelling. No gout. Left foot pain NEUROLOGIC: No numbness, tingling, or ataxia. No seizure-type activity.  PSYCHIATRIC: No anxiety. No insomnia. No ADD.    Vitals:   Vitals:   03/12/16 0402 03/12/16 0702 03/12/16 1056 03/12/16 1355  BP: 138/63 (!) 126/51 (!) 109/50 (!) 128/52  Pulse: 67 (!) 58 64   Resp: 16  19   Temp: 98.5 F (36.9 C)  98.7 F (37.1 C)   TempSrc: Oral  Oral   SpO2: 100% 99% 99%   Weight:      Height:        Wt Readings from Last 3 Encounters:  03/10/16 95 lb (43.1 kg)  07/30/15 88 lb (39.9 kg)  05/12/15 88 lb (39.9 kg)      Intake/Output Summary (Last 24 hours) at 03/12/16 1509 Last data filed at 03/12/16 1219  Gross per 24 hour  Intake           893.45 ml  Output              450 ml  Net           443.45 ml    Physical Exam:   GENERAL: Pleasant-appearing in no apparent distress.  HEAD, EYES, EARS, NOSE AND THROAT: Atraumatic, normocephalic. Extraocular muscles are intact. Pupils equal and reactive  to light. Sclerae anicteric. No conjunctival injection. No oro-pharyngeal erythema.  NECK: Supple. There is no jugular venous distention. No bruits, no lymphadenopathy, no thyromegaly.  HEART: S1, S2,regular rate and rhythm, No murmurs, no rubs, no clicks.  LUNGS: Clear to auscultation bilaterally. No rales or rhonchi. No wheezes.  ABDOMEN: Soft, flat, nontender, nondistended. Has good bowel sounds. No hepatosplenomegaly appreciated.  EXTREMITIES: No evidence of any cyanosis, clubbing, or peripheral edema.  +2 pedal and radial pulses bilaterally left foot with some bruising. Power 2/5 in both legs. NEUROLOGIC: The patient is alert, awake, and oriented x3 with no focal motor or sensory deficits appreciated bilaterally.  SKIN: Moist and warm with no rashes appreciated.  Psych: Not anxious, depressed LN: No inguinal LN enlargement    Antibiotics   Anti-infectives    Start     Dose/Rate Route Frequency Ordered Stop   03/11/16 1000  trimethoprim (TRIMPEX) tablet 100 mg     100 mg Oral Daily 03/10/16 2019     03/10/16 2200  cefTRIAXone (ROCEPHIN) 1 g in dextrose 5 % 50 mL IVPB  Status:  Discontinued     1 g 100 mL/hr over 30 Minutes Intravenous Every 24 hours 03/10/16 2019 03/11/16 1256      Medications   Scheduled Meds: . aspirin EC  81 mg Oral Daily  . citalopram  20 mg Oral Daily  . digoxin  0.125 mg Oral Daily  . divalproex  375 mg Oral BID  . enoxaparin (LOVENOX) injection  1 mg/kg (Order-Specific) Subcutaneous BID  . gabapentin  100 mg Oral Daily  . levETIRAcetam  500 mg Oral BID  .  magnesium oxide  400 mg Oral BID  . sodium chloride flush  3 mL Intravenous Q12H  . sodium chloride flush  3 mL Intravenous Q12H  . trimethoprim  100 mg Oral Daily   Continuous Infusions: . amiodarone 30 mg/hr (03/12/16 0510)   PRN Meds:.sodium chloride, acetaminophen **OR** acetaminophen, albuterol, ondansetron **OR** ondansetron (ZOFRAN) IV, sodium chloride flush   Data Review:   Micro Results Recent Results (from the past 240 hour(s))  Urine culture     Status: Abnormal   Collection Time: 03/10/16  3:53 PM  Result Value Ref Range Status   Specimen Description URINE, CLEAN CATCH  Final   Special Requests NONE  Final   Culture MULTIPLE SPECIES PRESENT, SUGGEST RECOLLECTION (A)  Final   Report Status 03/12/2016 FINAL  Final    Radiology Reports Dg Chest 2 View  Result Date: 03/10/2016 CLINICAL DATA:  Atrial fibrillation, recurrent urinary tract infection EXAM: CHEST  2 VIEW COMPARISON:  None. FINDINGS: There is mild cardiomegaly present with probable small effusions and mild pulmonary vascular congestion suggesting mild CHF. No focal pneumonia is seen. No bony abnormality is seen other than diffuse osteopenia. IMPRESSION: Possible mild CHF with cardiomegaly and small effusions with questionable mild pulmonary vascular congestion. Electronically Signed   By: Dwyane DeePaul  Barry M.D.   On: 03/10/2016 16:41   Dg Foot 2 Views Left  Result Date: 03/11/2016 CLINICAL DATA:  Fall.  Bruising and left foot pain. EXAM: LEFT FOOT - 2 VIEW COMPARISON:  None available. FINDINGS: Moderate osteopenia is present. No acute bone or soft tissue abnormality is present. A severe hallux valgus deformity is noted. Prominent enthesophytes are present at the second metatarsal IMPRESSION: 1. Severe hallux valgus deformity. 2. Osteopenia. 3. No acute bone or soft tissue abnormality. Electronically Signed   By: Marin Robertshristopher  Mattern M.D.   On: 03/11/2016 11:38  CBC  Recent Labs Lab 03/10/16 1552 03/11/16 0224  03/12/16 0525  WBC 6.9 7.1 5.2  HGB 13.9 11.8* 11.3*  HCT 40.7 34.0* 33.0*  PLT 131* 107* 93*  MCV 97.4 96.8 96.0  MCH 33.2 33.5 33.0  MCHC 34.1 34.6 34.3  RDW 13.9 13.5 13.7    Chemistries   Recent Labs Lab 03/10/16 1552 03/10/16 2212 03/11/16 0224 03/12/16 0525  NA 137  --  137  --   K 4.1  --  3.9  --   CL 105  --  107  --   CO2 26  --  24  --   GLUCOSE 104*  --  106*  --   BUN 23*  --  16  --   CREATININE 1.09*  --  0.74 0.85  CALCIUM 10.4*  --  9.1  --   MG 1.9 1.8  --   --    ------------------------------------------------------------------------------------------------------------------ estimated creatinine clearance is 32.9 mL/min (by C-G formula based on SCr of 0.85 mg/dL). ------------------------------------------------------------------------------------------------------------------ No results for input(s): HGBA1C in the last 72 hours. ------------------------------------------------------------------------------------------------------------------ No results for input(s): CHOL, HDL, LDLCALC, TRIG, CHOLHDL, LDLDIRECT in the last 72 hours. ------------------------------------------------------------------------------------------------------------------  Recent Labs  03/10/16 1552  TSH 0.940   ------------------------------------------------------------------------------------------------------------------ No results for input(s): VITAMINB12, FOLATE, FERRITIN, TIBC, IRON, RETICCTPCT in the last 72 hours.  Coagulation profile No results for input(s): INR, PROTIME in the last 168 hours.  No results for input(s): DDIMER in the last 72 hours.  Cardiac Enzymes  Recent Labs Lab 03/10/16 2212 03/11/16 0224 03/11/16 0933  TROPONINI <0.03 <0.03 <0.03   ------------------------------------------------------------------------------------------------------------------ Invalid input(s): POCBNP    Assessment & Plan  Patient is a 82 year old admitted  with generalized weakness noted to have A. fib with RVR  #1 Atrial fibrillation with RVR, back to NSR today. discontinued IV Cardizem and digoxin, on amiodarone drip. On lovenox bid. The patient is not good candidate for anticoagulation due to multiple falls at home. continue ASA. F/u Dr. Juliann Pares.  #2 possible cystitis Discontinued ceftriaxone Started oral trimpex.  #3 Acute diastolic CHF, LV EF: 60% -   65%. Due to Afib with RVR.   #4 history of seizures continue Keppra and Neurontin and Depakote  #5 left foot pain, improved. Pain control.  x-ray of her foot: Osteopenia.  #6 depression and anxiety continue Celexa       Code Status Orders        Start     Ordered   03/10/16 2020  Full code  Continuous     03/10/16 2019    Code Status History    Date Active Date Inactive Code Status Order ID Comments User Context   03/10/2016  8:19 PM 03/11/2016  7:53 AM Full Code [id number]  Shaune Pollack, MD Inpatient    Advance Directive Documentation   Flowsheet Row Most Recent Value  Type of Advance Directive  Living will  Pre-existing out of facility DNR order (yellow form or pink MOST form)  No data  "MOST" Form in Place?  No data           Consultscards  DVT Prophylaxis Lovenox monitor with low platelets  Lab Results  Component Value Date   PLT 93 (L) 03/12/2016     Time Spent in minutes  35 minutes greater than 50% of time spent in care coordination and counseling patient regarding the condition and plan of care. Discussed with her daughter.  Shaune Pollack M.D on 03/12/2016 at 3:09 PM  Between 7am to  6pm - Pager - [phone]  After 6pm go to [email or web address] - password [email or web address] [place] [facility]   Office  [phone]

## 2016-03-12 NOTE — Care Management Important Message (Signed)
Important Message  Patient Details  Name: Sherry AtesOzetta Q Kichline MRN: [id number] Date of Birth: [birth year]   Medicare Important Message Given:  Yes    Eber HongGreene, Shawnelle Spoerl R, RN 03/12/2016, 8:12 AM

## 2016-03-13 LAB — CBC
HCT: 36.6 % (ref 35.0–47.0)
HEMOGLOBIN: 12.8 g/dL (ref 12.0–16.0)
MCH: 33.3 pg (ref 26.0–34.0)
MCHC: 34.9 g/dL (ref 32.0–36.0)
MCV: 95.4 fL (ref 80.0–100.0)
Platelets: 94 10*3/uL — ABNORMAL LOW (ref 150–440)
RBC: 3.84 MIL/uL (ref 3.80–5.20)
RDW: 13.6 % (ref 11.5–14.5)
WBC: 4.6 10*3/uL (ref 3.6–11.0)

## 2016-03-13 MED ORDER — FUROSEMIDE 20 MG PO TABS
20.0000 mg | ORAL_TABLET | Freq: Two times a day (BID) | ORAL | Status: DC
  Administered 2016-03-14: 20 mg via ORAL
  Filled 2016-03-13: qty 1

## 2016-03-13 MED ORDER — DIGOXIN 125 MCG PO TABS
0.1250 mg | ORAL_TABLET | Freq: Every day | ORAL | Status: DC
  Administered 2016-03-13 – 2016-03-16 (×4): 0.125 mg via ORAL
  Filled 2016-03-13 (×4): qty 1

## 2016-03-13 MED ORDER — AMIODARONE HCL 200 MG PO TABS
200.0000 mg | ORAL_TABLET | Freq: Two times a day (BID) | ORAL | Status: DC
  Administered 2016-03-13 – 2016-03-16 (×7): 200 mg via ORAL
  Filled 2016-03-13 (×7): qty 1

## 2016-03-13 NOTE — Progress Notes (Signed)
PTHold Note  Patient Details Name: Sherry Crosby MRN: [id number] DOB: 11-15-1928   Cancelled Treatment:    Reason Eval/Treat Not Completed: Medical issues which prohibited therapy. Chart reviewed and RN consulted. RN reports that pt currently being weaned off amiodarone drip and should be off by the PM. Per telemetry monitor pt still in A-fib with HR ranging from 115 to 130 bpm at rest. Will hold pt currently and attempt PT evaluation later today if HR stabilizes and pt able to transition off drip.  Sharalyn InkJason D Huprich PT, DPT   Huprich,Jason 03/13/2016, 10:24 AM

## 2016-03-13 NOTE — Progress Notes (Signed)
SATURATION QUALIFICATIONS: (This note is used to comply with regulatory documentation for home oxygen)  Patient Saturations on Room Air at Rest = 86%  Patient Saturations on Room Air while Ambulating = 88 %  Patient Saturations on 2 Liters of oxygen while Ambulating = 94%  Please briefly explain why patient needs home oxygen:

## 2016-03-13 NOTE — NC FL2 (Signed)
MEDICAID FL2 LEVEL OF CARE SCREENING TOOL     IDENTIFICATION  Patient Name: Sherry Crosby Birthdate: 1928-11-14 Sex: female Admission Date (Current Location): 03/10/2016  [place]   Facility and Address:  [facility], [address], [place], [place] [address]      Provider Number: [id number]  Attending Physician Name and Address:  Shaune PollackQing Chen, MD  Relative Name and Phone Number:       Current Level of Care: Hospital Recommended Level of Care: Skilled Nursing Facility Prior Approval Number:    Date Approved/Denied:   PASRR Number:  [id number])  Discharge Plan: SNF    Current Diagnoses: Patient Active Problem List   Diagnosis Date Noted  . A-fib (HCC) 03/10/2016    Orientation RESPIRATION BLADDER Height & Weight     Self, Time, Place, Situation  Normal   Weight: 95 lb (43.1 kg) Height:  5\' 1"  (154.9 cm)  BEHAVIORAL SYMPTOMS/MOOD NEUROLOGICAL BOWEL NUTRITION STATUS   (None) Convulsions/Seizures   Diet (Heart)  AMBULATORY STATUS COMMUNICATION OF NEEDS Skin   Extensive Assist Verbally Normal                       Personal Care Assistance Level of Assistance  Feeding, Dressing, Bathing Bathing Assistance: Limited assistance Feeding assistance: Independent Dressing Assistance: Limited assistance     Functional Limitations Info  Sight, Hearing, Speech Sight Info: Impaired Hearing Info: Adequate Speech Info: Adequate    SPECIAL CARE FACTORS FREQUENCY  PT (By licensed PT)     PT Frequency:  (5)              Contractures      Additional Factors Info  Code Status, Allergies Code Status Info:  (Full Code) Allergies Info:  (No Known Allergies)           Current Medications (03/13/2016):  This is the current hospital active medication list Current Facility-Administered Medications  Medication Dose Route Frequency Provider Last Rate Last Dose  . 0.9 %  sodium chloride infusion   250 mL Intravenous PRN Shaune PollackQing Chen, MD      . acetaminophen (TYLENOL) tablet 650 mg  650 mg Oral Q6H PRN Shaune PollackQing Chen, MD       Or  . acetaminophen (TYLENOL) suppository 650 mg  650 mg Rectal Q6H PRN Shaune PollackQing Chen, MD      . albuterol (PROVENTIL) (2.5 MG/3ML) 0.083% nebulizer solution 2.5 mg  2.5 mg Nebulization Q2H PRN Shaune PollackQing Chen, MD      . amiodarone (PACERONE) tablet 200 mg  200 mg Oral BID Shaune PollackQing Chen, MD   200 mg at 03/13/16 0947  . aspirin EC tablet 81 mg  81 mg Oral Daily Shaune PollackQing Chen, MD   81 mg at 03/13/16 0947  . citalopram (CELEXA) tablet 20 mg  20 mg Oral Daily Shaune PollackQing Chen, MD   20 mg at 03/13/16 0947  . digoxin (LANOXIN) tablet 0.125 mg  0.125 mg Oral Daily Shaune PollackQing Chen, MD   0.125 mg at 03/13/16 1352  . divalproex (DEPAKOTE) DR tablet 375 mg  375 mg Oral BID Shaune PollackQing Chen, MD   375 mg at 03/13/16 0946  . enoxaparin (LOVENOX) injection 40 mg  1 mg/kg (Order-Specific) Subcutaneous BID Delsa BernKelly M Fuhrmann, RPH   40 mg at 03/13/16 [id number]  . furosemide (LASIX) tablet 20 mg  20 mg Oral BID Shaune PollackQing Chen, MD      . gabapentin (NEURONTIN) capsule 100 mg  100 mg  Oral Daily Shaune Pollack, MD   100 mg at 03/13/16 0947  . levETIRAcetam (KEPPRA) tablet 500 mg  500 mg Oral BID Shaune Pollack, MD   500 mg at 03/13/16 0947  . ondansetron (ZOFRAN) tablet 4 mg  4 mg Oral Q6H PRN Shaune Pollack, MD       Or  . ondansetron [facility]) injection 4 mg  4 mg Intravenous Q6H PRN Shaune Pollack, MD      . sodium chloride flush (NS) 0.9 % injection 3 mL  3 mL Intravenous Q12H Shaune Pollack, MD   3 mL at 03/11/16 2151  . sodium chloride flush (NS) 0.9 % injection 3 mL  3 mL Intravenous Q12H Shaune Pollack, MD   3 mL at 03/13/16 0957  . sodium chloride flush (NS) 0.9 % injection 3 mL  3 mL Intravenous PRN Shaune Pollack, MD      . trimethoprim (TRIMPEX) tablet 100 mg  100 mg Oral Daily Shaune Pollack, MD   100 mg at 03/13/16 1610     Discharge Medications: Please see discharge summary for a list of discharge medications.  Relevant Imaging Results:  Relevant Lab  Results:   Additional Information  (SSN [id number])  Verta Ellen Nissim Fleischer, LCSW

## 2016-03-13 NOTE — Clinical Social Work Note (Signed)
Clinical Social Work Assessment  Patient Details  Name: Sherry Crosby MRN: [id number] Date of Birth: 1929-04-01  Date of referral:  03/13/16               Reason for consult:  Discharge Planning                Permission sought to share information with:  Family Supports Permission granted to share information::  Yes, Verbal Permission Granted  Name::        Agency::     Relationship::   (Daughter)  Contact Information:     Housing/Transportation Living arrangements for the past 2 months:  Single Family Home Source of Information:  Patient, Adult Children Patient Interpreter Needed:  None Criminal Activity/Legal Involvement Pertinent to Current Situation/Hospitalization:  No - Comment as needed Significant Relationships:  Adult Children Lives with:  Self Do you feel safe going back to the place where you live?  Yes Need for family participation in patient care:  Yes (Comment) (Daughter)  Care giving concerns:  PT recommended SNF.    Social Worker assessment / plan:  CSW met with patient at bedside. CSW introduced herself and role to patient and her daughter. Per patient and her daughter she's agreeable to go to STR at discharge . Preference [facility]. CSW granted permission to send SNF referral to SNFs in [facility] and [place]. FL2/ PASRR competed and referral faxed out. Awaiting bed offers.   Employment status:  Retired Nurse, adult PT Recommendations:  [place] / Referral to community resources:  [place]  Patient/Family's Response to care:  Patient's family in agreement for patient to go to STR at discharge.   Patient/Family's Understanding of and Emotional Response to Diagnosis, Current Treatment, and Prognosis:  Patient's daughter eports she understands patient's Diagnosis, Current Treatment, and Prognosis . Thanked CSW for assistance.   Emotional Assessment Appearance:  Appears stated  age Attitude/Demeanor/Rapport:   (None) Affect (typically observed):  Accepting, Calm, Pleasant Orientation:  Oriented to Self, Oriented to Place, Oriented to  Time, Oriented to Situation Alcohol / Substance use:  Not Applicable Psych involvement (Current and /or in the community):  No (Comment)  Discharge Needs  Concerns to be addressed:  Discharge Planning Concerns Readmission within the last 30 days:  No Current discharge risk:  Chronically ill Barriers to Discharge:  Continued Medical Work up   [employer], [place] 03/13/2016, 4:02 PM

## 2016-03-13 NOTE — Evaluation (Signed)
Physical Therapy Evaluation Patient Details Name: Sherry Crosby MRN: [id number] DOB: December 10, 1928 Today's Date: 03/13/2016   History of Present Illness  presented to ER from PCP and admitted for management of Afib wit RVR.  Patient initially requiring cardizem/amioderone drip; now transitioned to PO.  Clinical Impression  Patient globally weak and deconditioned, requiring extensive physical assist (heavy mod +1) for all mobility this date. Very poor sitting/standing balance (heavy posterior trunk lean) with limited/no attempts at spontaneous correction.  Very high risk for fall with mobility efforts at this time. Unsafe to complete without heavy +1 and use of RW. HR remains rather volatile, with elevation to 140-150s (peak 151) with minimal functional activity.  Additional activity deferred as a result.  RN informed/aware of response to activity; returned to baseline (low 100s) end of session.  Will continue to assess/progress mobility as medically appropriate. Would benefit from skilled PT to address above deficits and promote optimal return to PLOF; recommend transition to STR upon discharge from acute hospitalization.     Follow Up Recommendations SNF    Equipment Recommendations  Rolling walker with 5" wheels    Recommendations for Other Services       Precautions / Restrictions Precautions Precautions: Fall Restrictions Weight Bearing Restrictions: No      Mobility  Bed Mobility Overal bed mobility: Needs Assistance Bed Mobility: Supine to Sit;Sit to Supine     Supine to sit: Max assist Sit to supine: Max assist      Transfers Overall transfer level: Needs assistance Equipment used: Rolling walker (2 wheeled) Transfers: Sit to/from Stand Sit to Stand: Mod assist         General transfer comment: maintains very kyphotic posture with excessive post pelvic tilt; heavy posterior weight shift with all sitting balance, sit/stand  transitions  Ambulation/Gait Ambulation/Gait assistance: Mod assist Ambulation Distance (Feet): 4 Feet Assistive device: Rolling walker (2 wheeled)       General Gait Details: very short, shuffling steps with heavy posterior weight shift.  Minimal/no spontaneous righting reactions; very high fall risk.  HR elevated (peak 151) with minimal activity; additional activity deferred as a result.  Stairs            Wheelchair Mobility    Modified Rankin (Stroke Patients Only)       Balance Overall balance assessment: Needs assistance Sitting-balance support: No upper extremity supported;Feet supported Sitting balance-Leahy Scale: Poor     Standing balance support: No upper extremity supported Standing balance-Leahy Scale: Zero                               Pertinent Vitals/Pain Pain Assessment: No/denies pain    Home Living Family/patient expects to be discharged to:: Private residence Living Arrangements: Spouse/significant other Available Help at Discharge: Family Type of Home: House Home Access: Stairs to enter Entrance Stairs-Rails: Can reach both Entrance Stairs-Number of Steps: 4 Home Layout: One level Home Equipment: Walker - 2 wheels;Walker - 4 wheels Additional Comments: Has aide that comes 1/2 day during the week for assist with household chores    Prior Function Level of Independence: Independent with assistive device(s)         Comments: Mod indep with ADLs and simple household activities/mobility; has access to assist device, but refuses use.  Does endorse 2-3 falls/week; most recent seizure approx 10 days prior (focal seizures per family)     Hand Dominance        Extremity/Trunk Assessment  Upper Extremity Assessment: Generalized weakness           Lower Extremity Assessment: Generalized weakness (grossly at least 3+ to 4-/5 throughout; globally weak and deconditioned)      Cervical / Trunk Assessment: Kyphotic   Communication      Cognition Arousal/Alertness: Awake/alert Behavior During Therapy: WFL for tasks assessed/performed Overall Cognitive Status: Within Functional Limits for tasks assessed                      General Comments      Exercises Other Exercises Other Exercises: Unsupported sitting edge of bed, participated with dynamic reaching activity to promote forward weight shift, lumbar extension and anterior weight translation for improved balance/midline orientation in A/P plane. Mod manual faciltiation from therapist throughout at lumbar spine Other Exercises: Toilet transfer, SPT with RW, mod assist +1 for standing balance and safety.  Patient very weak and unsteady; heavy posterior trunk lean, worsening with dynamic mobility and fatigue.   Assessment/Plan    PT Assessment Patient needs continued PT services  PT Problem List Decreased strength;Decreased range of motion;Decreased activity tolerance;Decreased balance;Decreased mobility;Decreased knowledge of use of DME;Decreased safety awareness;Decreased knowledge of precautions          PT Treatment Interventions DME instruction;Gait training;Stair training;Functional mobility training;Therapeutic activities;Therapeutic exercise;Balance training;Patient/family education    PT Goals (Current goals can be found in the Care Plan section)  Acute Rehab PT Goals Patient Stated Goal: to get stronger PT Goal Formulation: With patient/family Time For Goal Achievement: 03/27/16 Potential to Achieve Goals: Fair    Frequency Min 2X/week   Barriers to discharge Decreased caregiver support      Co-evaluation               End of Session Equipment Utilized During Treatment: Gait belt Activity Tolerance: Patient limited by fatigue;Treatment limited secondary to medical complications (Comment) (elevated HR with minimal activity) Patient left: in bed;with call bell/phone within reach;with bed alarm set Nurse  Communication: Mobility status (HR response to activity)         Time: [id number] PT Time Calculation (min) (ACUTE ONLY): 32 min   Charges:   PT Evaluation $PT Eval Moderate Complexity: 1 Procedure PT Treatments $Therapeutic Activity: 8-22 mins   PT G Codes:        Arbie Reisz H. Manson PasseyBrown, PT, DPT, NCS 03/13/16, 4:10 PM [phone]

## 2016-03-13 NOTE — Progress Notes (Signed)
CSW was informed by [facility] that they cannot accept patient due to not being able to accept her insurance. New preference is [facility]. CSW contacted Jomarie LongsJoseph- Admissions Coordinator at [facility] and left a voicemail requesting he reviewed patient. CSW will continue to follow and assist.  Woodroe Modehristina Tabias Swayze, MSW, LCSW, LCAS-A Clinical Social Worker [phone]

## 2016-03-13 NOTE — Progress Notes (Signed)
[facility] Physicians - [place] at [facility]                                                                                                                                                                                            Patient Demographics   Sherry Crosby, is a 82 y.o. female, DOB - 1929-05-02, UJW:[id number]  Admit date - 03/10/2016   Admitting Physician Shaune Pollack, MD  Outpatient Primary MD for the patient is RINGEL, Dillard Essex, MD   LOS - 3  Subjective: Patient with a history of atrial fibrillation admitted with atrial fibrillation with rapid ventricular rate  still weak, was on amiodarone drip this am, back to NSR since yesterday. But P afib RVR this am, back to NSR again. Review of Systems:   CONSTITUTIONAL: No documented fever. No fatigue, Positive weakness. No weight gain, no weight loss.  EYES: No blurry or double vision.  ENT: No tinnitus. No postnasal drip. No redness of the oropharynx.  RESPIRATORY: No cough, no wheeze, no hemoptysis. No dyspnea.  CARDIOVASCULAR: No chest pain. No orthopnea. No palpitations. No syncope.  GASTROINTESTINAL: No nausea, no vomiting or diarrhea. No abdominal pain. No melena or hematochezia.  GENITOURINARY: No dysuria or hematuria.  ENDOCRINE: No polyuria or nocturia. No heat or cold intolerance.  HEMATOLOGY: No anemia. No bruising. No bleeding.  INTEGUMENTARY: No rashes. No lesions.  MUSCULOSKELETAL: No arthritis. No swelling. No gout. Left foot pain NEUROLOGIC: No numbness, tingling, or ataxia. No seizure-type activity.  PSYCHIATRIC: No anxiety. No insomnia. No ADD.    Vitals:   Vitals:   03/13/16 0106 03/13/16 0451 03/13/16 0923 03/13/16 1152  BP: 121/66 124/68 140/85 120/77  Pulse: 90 81 (!) 111 (!) 109  Resp:  16 20 18   Temp:  98.3 F (36.8 C)    TempSrc:      SpO2:  92% 98% 99%  Weight:      Height:        Wt Readings from Last 3 Encounters:  03/10/16 95 lb (43.1 kg)  07/30/15 88 lb (39.9 kg)   05/12/15 88 lb (39.9 kg)     Intake/Output Summary (Last 24 hours) at 03/13/16 1331 Last data filed at 03/13/16 1015  Gross per 24 hour  Intake           215.47 ml  Output             1200 ml  Net          -984.53 ml    Physical Exam:   GENERAL: Pleasant-appearing in no apparent distress.  HEAD, EYES, EARS, NOSE AND THROAT: Atraumatic, normocephalic.  Extraocular muscles are intact. Pupils equal and reactive to light. Sclerae anicteric. No conjunctival injection. No oro-pharyngeal erythema.  NECK: Supple. There is no jugular venous distention. No bruits, no lymphadenopathy, no thyromegaly.  HEART: S1, S2,regular rate and rhythm, No murmurs, no rubs, no clicks.  LUNGS: Clear to auscultation bilaterally. No rales or rhonchi. No wheezes.  ABDOMEN: Soft, flat, nontender, nondistended. Has good bowel sounds. No hepatosplenomegaly appreciated.  EXTREMITIES: No evidence of any cyanosis, clubbing, or peripheral edema.  +2 pedal and radial pulses bilaterally left foot with some bruising. Power 2/5 in both legs. NEUROLOGIC: The patient is alert, awake, and oriented x3 with no focal motor or sensory deficits appreciated bilaterally.  SKIN: Moist and warm with no rashes appreciated.  Psych: Not anxious, depressed LN: No inguinal LN enlargement    Antibiotics   Anti-infectives    Start     Dose/Rate Route Frequency Ordered Stop   03/11/16 1000  trimethoprim (TRIMPEX) tablet 100 mg     100 mg Oral Daily 03/10/16 2019     03/10/16 2200  cefTRIAXone (ROCEPHIN) 1 g in dextrose 5 % 50 mL IVPB  Status:  Discontinued     1 g 100 mL/hr over 30 Minutes Intravenous Every 24 hours 03/10/16 2019 03/11/16 1256      Medications   Scheduled Meds: . amiodarone  200 mg Oral BID  . aspirin EC  81 mg Oral Daily  . citalopram  20 mg Oral Daily  . divalproex  375 mg Oral BID  . enoxaparin (LOVENOX) injection  1 mg/kg (Order-Specific) Subcutaneous BID  . gabapentin  100 mg Oral Daily  . levETIRAcetam   500 mg Oral BID  . sodium chloride flush  3 mL Intravenous Q12H  . sodium chloride flush  3 mL Intravenous Q12H  . trimethoprim  100 mg Oral Daily   Continuous Infusions:   PRN Meds:.sodium chloride, acetaminophen **OR** acetaminophen, albuterol, ondansetron **OR** ondansetron (ZOFRAN) IV, sodium chloride flush   Data Review:   Micro Results Recent Results (from the past 240 hour(s))  Urine culture     Status: Abnormal   Collection Time: 03/10/16  3:53 PM  Result Value Ref Range Status   Specimen Description URINE, CLEAN CATCH  Final   Special Requests NONE  Final   Culture MULTIPLE SPECIES PRESENT, SUGGEST RECOLLECTION (A)  Final   Report Status 03/12/2016 FINAL  Final    Radiology Reports Dg Chest 2 View  Result Date: 03/10/2016 CLINICAL DATA:  Atrial fibrillation, recurrent urinary tract infection EXAM: CHEST  2 VIEW COMPARISON:  None. FINDINGS: There is mild cardiomegaly present with probable small effusions and mild pulmonary vascular congestion suggesting mild CHF. No focal pneumonia is seen. No bony abnormality is seen other than diffuse osteopenia. IMPRESSION: Possible mild CHF with cardiomegaly and small effusions with questionable mild pulmonary vascular congestion. Electronically Signed   By: Dwyane Dee M.D.   On: 03/10/2016 16:41   Dg Foot 2 Views Left  Result Date: 03/11/2016 CLINICAL DATA:  Fall.  Bruising and left foot pain. EXAM: LEFT FOOT - 2 VIEW COMPARISON:  None available. FINDINGS: Moderate osteopenia is present. No acute bone or soft tissue abnormality is present. A severe hallux valgus deformity is noted. Prominent enthesophytes are present at the second metatarsal IMPRESSION: 1. Severe hallux valgus deformity. 2. Osteopenia. 3. No acute bone or soft tissue abnormality. Electronically Signed   By: Marin Roberts M.D.   On: 03/11/2016 11:38     CBC  Recent Labs Lab 03/10/16  1552 03/11/16 0224 03/12/16 0525 03/13/16 0413  WBC 6.9 7.1 5.2 4.6  HGB  13.9 11.8* 11.3* 12.8  HCT 40.7 34.0* 33.0* 36.6  PLT 131* 107* 93* 94*  MCV 97.4 96.8 96.0 95.4  MCH 33.2 33.5 33.0 33.3  MCHC 34.1 34.6 34.3 34.9  RDW 13.9 13.5 13.7 13.6    Chemistries   Recent Labs Lab 03/10/16 1552 03/10/16 2212 03/11/16 0224 03/12/16 0525  NA 137  --  137  --   K 4.1  --  3.9  --   CL 105  --  107  --   CO2 26  --  24  --   GLUCOSE 104*  --  106*  --   BUN 23*  --  16  --   CREATININE 1.09*  --  0.74 0.85  CALCIUM 10.4*  --  9.1  --   MG 1.9 1.8  --   --    ------------------------------------------------------------------------------------------------------------------ estimated creatinine clearance is 32.9 mL/min (by C-G formula based on SCr of 0.85 mg/dL). ------------------------------------------------------------------------------------------------------------------ No results for input(s): HGBA1C in the last 72 hours. ------------------------------------------------------------------------------------------------------------------ No results for input(s): CHOL, HDL, LDLCALC, TRIG, CHOLHDL, LDLDIRECT in the last 72 hours. ------------------------------------------------------------------------------------------------------------------  Recent Labs  03/10/16 1552  TSH 0.940   ------------------------------------------------------------------------------------------------------------------ No results for input(s): VITAMINB12, FOLATE, FERRITIN, TIBC, IRON, RETICCTPCT in the last 72 hours.  Coagulation profile No results for input(s): INR, PROTIME in the last 168 hours.  No results for input(s): DDIMER in the last 72 hours.  Cardiac Enzymes  Recent Labs Lab 03/10/16 2212 03/11/16 0224 03/11/16 0933  TROPONINI <0.03 <0.03 <0.03   ------------------------------------------------------------------------------------------------------------------ Invalid input(s): POCBNP    Assessment & Plan  Patient is a 82 year old admitted with  generalized weakness noted to have A. fib with RVR  #1 Atrial fibrillation with RVR, back to NSR since yesterday but P Arib with RVR at 120 this am. discontinued IV Cardizem and digoxin, on amiodarone drip. On lovenox bid. The patient is not good candidate for anticoagulation due to multiple falls at home. continue ASA and lovenox for now, add plavix on discharge, discontinue amiodarone drip and start po 200 mg bid for 7 days most, then change to daily after discharge, add digoxin 0.125 mg daily po,  per Dr. Juliann Pares.  #2 possible cystitis Discontinued ceftriaxone Started oral trimpex.  #3 Acute diastolic CHF, LV EF: 60% -   65%. Due to Afib with RVR. started lasix.  * Hypoxia. Continue O2 [place] 2 L.  #4 history of seizures continue Keppra and Neurontin and Depakote  #5 left foot pain, improved. Pain control.  x-ray of her foot: Osteopenia.  #6 depression and anxiety continue Celexa  Weakness: PT.  Discussed with Dr. Juliann Pares.  Possible discharge in 2 days.     Code Status Orders        Start     Ordered   03/10/16 2020  Full code  Continuous     03/10/16 2019    Code Status History    Date Active Date Inactive Code Status Order ID Comments User Context   03/10/2016  8:19 PM 03/11/2016  7:53 AM Full Code [id number]  Shaune Pollack, MD Inpatient    Advance Directive Documentation   Flowsheet Row Most Recent Value  Type of Advance Directive  Living will  Pre-existing out of facility DNR order (yellow form or pink MOST form)  No data  "MOST" Form in Place?  No data  Consultscards  DVT Prophylaxis Lovenox monitor with low platelets  Lab Results  Component Value Date   PLT 94 (L) 03/13/2016     Time Spent in minutes  35 minutes greater than 50% of time spent in care coordination and counseling patient regarding the condition and plan of care. Discussed with her daughter.  Shaune Pollackhen, Sherry Crosby M.D on 03/13/2016 at 1:31 PM  Between 7am to 6pm - Pager -  [phone]  After 6pm go to [email or web address] - password [email or web address] [facility]  [facility]   Office  [phone]

## 2016-03-13 NOTE — Progress Notes (Signed)
Patients pulse goes up into the high 120s when she coughs, stays high during the coughing spell and then drops to 80's or 90's.

## 2016-03-13 NOTE — Care Management (Addendum)
PT consult has been delayed due to being on Amiodarone drip which has now being  weaned.  Anticipate physical therapy will be able to evaluate later today.  Heart rate still elevated.  Continue to anticipate that patient will need snf.  If able to discharge over the weekend is dependent on heart rate

## 2016-03-14 NOTE — Progress Notes (Signed)
ANTICOAGULATION CONSULT NOTE - FOLLOW UP   Pharmacy Consult for enoxaparin  Indication: atrial fibrillation  No Known Allergies  Patient Measurements: Height: 5\' 1"  (154.9 cm) Weight: 95 lb (43.1 kg) IBW/kg (Calculated) : 47.8  Vital Signs: Temp: 98.1 F (36.7 C) (09/16 0820) Temp Source: Oral (09/16 0820) BP: 131/52 (09/16 0820) Pulse Rate: 65 (09/16 0905)  Labs:  Recent Labs  03/12/16 0525 03/13/16 0413  HGB 11.3* 12.8  HCT 33.0* 36.6  PLT 93* 94*  CREATININE 0.85  --     Estimated Creatinine Clearance: 32.9 mL/min (by C-G formula based on SCr of 0.85 mg/dL).   Medical History: Past Medical History:  Diagnosis Date  . A-fib (HCC)   . Anxiety   . Depression   . High cholesterol   . Hx of degenerative disc disease   . Recurrent UTI   . Seizures [facility]HCC)    Assessment: 82 yo female admitted with A. Fib. Pharmacy consulted for enoxaparin dosing.   9/12: Plt: 131   Hgb: 13.9   Scr: 1.09 9/13: Plt: 107   Hgb: 11.8   Scr: 0.74    Plan:  Will continue Enoxaparin 40 mg SQ q12 hours.   Demetrius Charityeldrin D. Judyann Casasola, PharmD  Clinical Pharmacist 03/14/2016 10:32 AM

## 2016-03-14 NOTE — Clinical Social Work Note (Signed)
CSW visited patient and her sister at bedside to deliver bed offer from [facility]. Patient's sister Johnny BridgeMartha noted that she would call patient's son Minerva Areolaric to update him. CSW will con't to follow.  Argentina PonderKaren Martha Omega Durante, MSW LCSW-A [phone]

## 2016-03-14 NOTE — Progress Notes (Signed)
No significant change in assessment from yesterday.  Remains physically weak.  Unable to ambulate to BR.  Waiting for SNF placment.

## 2016-03-14 NOTE — Progress Notes (Signed)
[facility] Physicians - [place] at [facility]                                                                                                                                                                                            Patient Demographics   Sherry Crosby, is a 82 y.o. female, DOB - 04/30/1929, ZOX:[id number]  Admit date - 03/10/2016   Admitting Physician Shaune Pollack, MD  Outpatient Primary MD for the patient is Sherry Crosby, Dillard Essex, MD   LOS - 4  Subjective;off the  on amiodarone drip. Overall very weak Poor mouth intake.  Patient with a history of atrial fibrillation admitted with atrial fibrillation with rapid ventricular rate   Review of Systems:   CONSTITUTIONAL: No documented fever. No fatigue, Positive weakness. No weight gain, no weight loss.  EYES: No blurry or double vision.  ENT: No tinnitus. No postnasal drip. No redness of the oropharynx.  RESPIRATORY: No cough, no wheeze, no hemoptysis. No dyspnea.  CARDIOVASCULAR: No chest pain. No orthopnea. No palpitations. No syncope.  GASTROINTESTINAL: No nausea, no vomiting or diarrhea. No abdominal pain. No melena or hematochezia.  GENITOURINARY: No dysuria or hematuria.  ENDOCRINE: No polyuria or nocturia. No heat or cold intolerance.  HEMATOLOGY: No anemia. No bruising. No bleeding.  INTEGUMENTARY: No rashes. No lesions.  MUSCULOSKELETAL: No arthritis. No swelling. No gout. Left foot pain NEUROLOGIC: No numbness, tingling, or ataxia. No seizure-type activity.  PSYCHIATRIC: No anxiety. No insomnia. No ADD.    Vitals:   Vitals:   03/13/16 1953 03/14/16 0449 03/14/16 0820 03/14/16 0905  BP: 115/70 117/83 (!) 131/52   Pulse: 65 68 64 65  Resp: 18 18 16    Temp: 98.4 F (36.9 C) 98.1 F (36.7 C) 98.1 F (36.7 C)   TempSrc: Oral Oral Oral   SpO2: 96% 97% 97%   Weight:      Height:        Wt Readings from Last 3 Encounters:  03/10/16 43.1 kg (95 lb)  07/30/15 39.9 kg (88 lb)  05/12/15  39.9 kg (88 lb)     Intake/Output Summary (Last 24 hours) at 03/14/16 1104 Last data filed at 03/14/16 8119  Gross per 24 hour  Intake              240 ml  Output              450 ml  Net             -210 ml    Physical Exam:   GENERAL: Pleasant-appearing in no apparent distress.  HEAD, EYES, EARS, NOSE AND THROAT:  Atraumatic, normocephalic. Extraocular muscles are intact. Pupils equal and reactive to light. Sclerae anicteric. No conjunctival injection. No oro-pharyngeal erythema.  NECK: Supple. There is no jugular venous distention. No bruits, no lymphadenopathy, no thyromegaly.  HEART: S1, S2,regular rate and rhythm, No murmurs, no rubs, no clicks.  LUNGS: Clear to auscultation bilaterally. No rales or rhonchi. No wheezes.  ABDOMEN: Soft, flat, nontender, nondistended. Has good bowel sounds. No hepatosplenomegaly appreciated.  EXTREMITIES: No evidence of any cyanosis, clubbing, or peripheral edema.  +2 pedal and radial pulses bilaterally left foot with some bruising. Power 2/5 in both legs. NEUROLOGIC: The patient is alert, awake, and oriented x3 with no focal motor or sensory deficits appreciated bilaterally.  SKIN: Moist and warm with no rashes appreciated.  Psych: Not anxious, depressed LN: No inguinal LN enlargement    Antibiotics   Anti-infectives    Start     Dose/Rate Route Frequency Ordered Stop   03/11/16 1000  trimethoprim (TRIMPEX) tablet 100 mg     100 mg Oral Daily 03/10/16 2019     03/10/16 2200  cefTRIAXone (ROCEPHIN) 1 g in dextrose 5 % 50 mL IVPB  Status:  Discontinued     1 g 100 mL/hr over 30 Minutes Intravenous Every 24 hours 03/10/16 2019 03/11/16 1256      Medications   Scheduled Meds: . amiodarone  200 mg Oral BID  . aspirin EC  81 mg Oral Daily  . citalopram  20 mg Oral Daily  . digoxin  0.125 mg Oral Daily  . divalproex  375 mg Oral BID  . enoxaparin (LOVENOX) injection  1 mg/kg (Order-Specific) Subcutaneous BID  . furosemide  20 mg Oral BID   . gabapentin  100 mg Oral Daily  . levETIRAcetam  500 mg Oral BID  . sodium chloride flush  3 mL Intravenous Q12H  . sodium chloride flush  3 mL Intravenous Q12H  . trimethoprim  100 mg Oral Daily   Continuous Infusions:   PRN Meds:.sodium chloride, acetaminophen **OR** acetaminophen, albuterol, ondansetron **OR** ondansetron (ZOFRAN) IV, sodium chloride flush   Data Review:   Micro Results Recent Results (from the past 240 hour(s))  Urine culture     Status: Abnormal   Collection Time: 03/10/16  3:53 PM  Result Value Ref Range Status   Specimen Description URINE, CLEAN CATCH  Final   Special Requests NONE  Final   Culture MULTIPLE SPECIES PRESENT, SUGGEST RECOLLECTION (A)  Final   Report Status 03/12/2016 FINAL  Final    Radiology Reports Dg Chest 2 View  Result Date: 03/10/2016 CLINICAL DATA:  Atrial fibrillation, recurrent urinary tract infection EXAM: CHEST  2 VIEW COMPARISON:  None. FINDINGS: There is mild cardiomegaly present with probable small effusions and mild pulmonary vascular congestion suggesting mild CHF. No focal pneumonia is seen. No bony abnormality is seen other than diffuse osteopenia. IMPRESSION: Possible mild CHF with cardiomegaly and small effusions with questionable mild pulmonary vascular congestion. Electronically Signed   By: Dwyane Dee M.D.   On: 03/10/2016 16:41   Dg Foot 2 Views Left  Result Date: 03/11/2016 CLINICAL DATA:  Fall.  Bruising and left foot pain. EXAM: LEFT FOOT - 2 VIEW COMPARISON:  None available. FINDINGS: Moderate osteopenia is present. No acute bone or soft tissue abnormality is present. A severe hallux valgus deformity is noted. Prominent enthesophytes are present at the second metatarsal IMPRESSION: 1. Severe hallux valgus deformity. 2. Osteopenia. 3. No acute bone or soft tissue abnormality. Electronically Signed   By: Cristal Deer  Mattern M.D.   On: 03/11/2016 11:38     CBC  Recent Labs Lab 03/10/16 1552 03/11/16 0224  03/12/16 0525 03/13/16 0413  WBC 6.9 7.1 5.2 4.6  HGB 13.9 11.8* 11.3* 12.8  HCT 40.7 34.0* 33.0* 36.6  PLT 131* 107* 93* 94*  MCV 97.4 96.8 96.0 95.4  MCH 33.2 33.5 33.0 33.3  MCHC 34.1 34.6 34.3 34.9  RDW 13.9 13.5 13.7 13.6    Chemistries   Recent Labs Lab 03/10/16 1552 03/10/16 2212 03/11/16 0224 03/12/16 0525  NA 137  --  137  --   K 4.1  --  3.9  --   CL 105  --  107  --   CO2 26  --  24  --   GLUCOSE 104*  --  106*  --   BUN 23*  --  16  --   CREATININE 1.09*  --  0.74 0.85  CALCIUM 10.4*  --  9.1  --   MG 1.9 1.8  --   --    ------------------------------------------------------------------------------------------------------------------ estimated creatinine clearance is 32.9 mL/min (by C-G formula based on SCr of 0.85 mg/dL). ------------------------------------------------------------------------------------------------------------------ No results for input(s): HGBA1C in the last 72 hours. ------------------------------------------------------------------------------------------------------------------ No results for input(s): CHOL, HDL, LDLCALC, TRIG, CHOLHDL, LDLDIRECT in the last 72 hours. ------------------------------------------------------------------------------------------------------------------ No results for input(s): TSH, T4TOTAL, T3FREE, THYROIDAB in the last 72 hours.  Invalid input(s): FREET3 ------------------------------------------------------------------------------------------------------------------ No results for input(s): VITAMINB12, FOLATE, FERRITIN, TIBC, IRON, RETICCTPCT in the last 72 hours.  Coagulation profile No results for input(s): INR, PROTIME in the last 168 hours.  No results for input(s): DDIMER in the last 72 hours.  Cardiac Enzymes  Recent Labs Lab 03/10/16 2212 03/11/16 0224 03/11/16 0933  TROPONINI <0.03 <0.03 <0.03    ------------------------------------------------------------------------------------------------------------------ Invalid input(s): POCBNP    Assessment & Plan  Patient is a 82 year old admitted with generalized weakness noted to have A. fib with RVR  #1 Atrial fibrillation with RVR, Controlled, continue  by mouth amiodarone. The patient is not good candidate for anticoagulation due to multiple falls at home. continue ASA and lovenox for now, add plavix on discharge, discontinue damiodarone drip and started  po 200 mg bid for 7 days most, then change to daily after discharge, add digoxin 0.125 mg daily po,  per Dr. Juliann Pares.  #2 possible cystitis Discontinued ceftriaxone Started oral trimpex.  #3 Acute diastolic CHF, LV EF: 60% -   65%. Due to Afib with RVR. started lasix.  * Hypoxia. Continue O2 [place] 2 L.  #4 history of seizures continue Keppra and Neurontin and Depakote  #5 left foot pain, improved. Pain control.  x-ray of her foot: Osteopenia.  #6 depression and anxiety continue Celexa  Weakness: PT. Recommends  SNF placement  Discussed with Dr. Juliann Pares.       Code Status Orders        Start     Ordered   03/10/16 2020  Full code  Continuous     03/10/16 2019    Code Status History    Date Active Date Inactive Code Status Order ID Comments User Context   03/10/2016  8:19 PM 03/11/2016  7:53 AM Full Code [id number]  Shaune Pollack, MD Inpatient    Advance Directive Documentation   Flowsheet Row Most Recent Value  Type of Advance Directive  Living will  Pre-existing out of facility DNR order (yellow form or pink MOST form)  No data  "MOST" Form in Place?  No data  Consultscards  DVT Prophylaxis Lovenox monitor with low platelets  Lab Results  Component Value Date   PLT 94 (L) 03/13/2016     Time Spent in minutes  35 minutes greater than 50% of time spent in care coordination and counseling patient regarding the condition and plan of  care. Discussed with her daughter.  Katha HammingKONIDENA,Beya Tipps M.D on 03/14/2016 at 11:04 AM  Between 7am to 6pm - Pager - [phone]  After 6pm go to [email or web address] - password [email or web address] [facility]  [facility]   Office  [phone]

## 2016-03-15 LAB — CHLAMYDIA/NGC RT PCR (ARMC ONLY)
Chlamydia Tr: NOT DETECTED
N GONORRHOEAE: NOT DETECTED

## 2016-03-15 MED ORDER — MICONAZOLE NITRATE 2 % VA CREA
1.0000 | TOPICAL_CREAM | Freq: Every day | VAGINAL | Status: DC
  Administered 2016-03-15: 1 via VAGINAL
  Filled 2016-03-15: qty 45

## 2016-03-15 MED ORDER — ENOXAPARIN SODIUM 60 MG/0.6ML ~~LOC~~ SOLN
1.0000 mg/kg | Freq: Two times a day (BID) | SUBCUTANEOUS | Status: DC
  Administered 2016-03-15 – 2016-03-16 (×3): 45 mg via SUBCUTANEOUS
  Filled 2016-03-15 (×4): qty 0.6

## 2016-03-15 NOTE — Progress Notes (Signed)
Patient has yellowish vaginal drainage with a bad odor.  Discussed with Dr. Luberta MutterKonidena.  Will culture and tx.

## 2016-03-15 NOTE — Progress Notes (Signed)
ANTICOAGULATION CONSULT NOTE - FOLLOW UP   Pharmacy Consult for enoxaparin  Indication: atrial fibrillation  No Known Allergies  Patient Measurements: Height: 5\' 1"  (154.9 cm) Weight: 95 lb (43.1 kg) IBW/kg (Calculated) : 47.8  Vital Signs: Temp: 98.1 F (36.7 C) (09/17 0734) Temp Source: Oral (09/17 0734) BP: 127/53 (09/17 0734) Pulse Rate: 62 (09/17 0734)  Labs:  Recent Labs  03/13/16 0413  HGB 12.8  HCT 36.6  PLT 94*    Estimated Creatinine Clearance: 32.9 mL/min (by C-G formula based on SCr of 0.85 mg/dL).   Medical History: Past Medical History:  Diagnosis Date  . A-fib (HCC)   . Anxiety   . Depression   . High cholesterol   . Hx of degenerative disc disease   . Recurrent UTI   . Seizures [name](HCC)    Assessment: 82 yo female admitted with A. Fib. Pharmacy consulted for enoxaparin dosing.   9/12: Plt: 131   Hgb: 13.9   Scr: 1.09 9/13: Plt: 107   Hgb: 11.8   Scr: 0.74    Plan:  Will change to Enoxaparin 45 mg SQ q12 hours.   Sherry Charityeldrin D. Sherry Crosby, PharmD  Clinical Pharmacist 03/15/2016 9:32 AM

## 2016-03-15 NOTE — Progress Notes (Signed)
[facility] [place] [facility]                                                                                                                                                                                            Patient Demographics   Sherry Crosby, is a 82 y.o. female, DOB - 14-Dec-1928, ZOX:[id number]  Admit date - 03/10/2016   Admitting Physician Sherry Pollack, MD  Outpatient Primary MD for the patient is RINGEL, Dillard Essex, MD   LOS - 5  Subjective;Seen today, has dementia but is able to answer the questions appropriately, no overnight events, heart rate controlled.  Patient with a history of atrial fibrillation admitted with atrial fibrillation with rapid ventricular rate   Review of Systems:   CONSTITUTIONAL: No documented fever. No fatigue, Positive weakness. No weight gain, no weight loss.  EYES: No blurry or double vision.  ENT: No tinnitus. No postnasal drip. No redness of the oropharynx.  RESPIRATORY: No cough, no wheeze, no hemoptysis. No dyspnea.  CARDIOVASCULAR: No chest pain. No orthopnea. No palpitations. No syncope.  GASTROINTESTINAL: No nausea, no vomiting or diarrhea. No abdominal pain. No melena or hematochezia.  GENITOURINARY: No dysuria or hematuria.  ENDOCRINE: No polyuria or nocturia. No heat or cold intolerance.  HEMATOLOGY: No anemia. No bruising. No bleeding.  INTEGUMENTARY: No rashes. No lesions.  MUSCULOSKELETAL: No arthritis. No swelling. No gout. Left foot pain NEUROLOGIC: No numbness, tingling, or ataxia. No seizure-type activity.  PSYCHIATRIC: No anxiety. No insomnia. No ADD.    Vitals:   Vitals:   03/14/16 1937 03/15/16 0437 03/15/16 0734 03/15/16 0956  BP: (!) 126/49 (!) 130/56 (!) 127/53   Pulse: 66 62 62 62  Resp: 16 18 18    Temp: 98.3 F (36.8 C) 98.5 F (36.9 C) 98.1 F (36.7 C)   TempSrc: Oral Oral Oral   SpO2: 100% 97% 96%   Weight:      Height:        Wt Readings from Last 3 Encounters:   03/10/16 43.1 kg (95 lb)  07/30/15 39.9 kg (88 lb)  05/12/15 39.9 kg (88 lb)     Intake/Output Summary (Last 24 hours) at 03/15/16 1023 Last data filed at 03/15/16 0736  Gross per 24 hour  Intake                0 ml  Output              700 ml  Net             -700 ml    Physical Exam:   GENERAL: Pleasant-appearing in  no apparent distress.  HEAD, EYES, EARS, NOSE AND THROAT: Atraumatic, normocephalic. Extraocular muscles are intact. Pupils equal and reactive to light. Sclerae anicteric. No conjunctival injection. No oro-pharyngeal erythema.  NECK: Supple. There is no jugular venous distention. No bruits, no lymphadenopathy, no thyromegaly.  HEART: S1, S2,regular rate and rhythm, No murmurs, no rubs, no clicks.  LUNGS: Clear to auscultation bilaterally. No rales or rhonchi. No wheezes.  ABDOMEN: Soft, flat, nontender, nondistended. Has good bowel sounds. No hepatosplenomegaly appreciated.  EXTREMITIES: No evidence of any cyanosis, clubbing, or peripheral edema.  +2 pedal and radial pulses bilaterally left foot with some bruising. Power 2/5 in both legs. NEUROLOGIC: The patient is alert, awake, and oriented x3 with no focal motor or sensory deficits appreciated bilaterally.  SKIN: Moist and warm with no rashes appreciated.  Psych: Not anxious, depressed LN: No inguinal LN enlargement    Antibiotics   Anti-infectives    Start     Dose/Rate Route Frequency Ordered Stop   03/11/16 1000  trimethoprim (TRIMPEX) tablet 100 mg     100 mg Oral Daily 03/10/16 2019     03/10/16 2200  cefTRIAXone (ROCEPHIN) 1 g in dextrose 5 % 50 mL IVPB  Status:  Discontinued     1 g 100 mL/hr over 30 Minutes Intravenous Every 24 hours 03/10/16 2019 03/11/16 1256      Medications   Scheduled Meds: . amiodarone  200 mg Oral BID  . aspirin EC  81 mg Oral Daily  . citalopram  20 mg Oral Daily  . digoxin  0.125 mg Oral Daily  . divalproex  375 mg Oral BID  . enoxaparin (LOVENOX) injection  1 mg/kg  Subcutaneous BID  . gabapentin  100 mg Oral Daily  . levETIRAcetam  500 mg Oral BID  . sodium chloride flush  3 mL Intravenous Q12H  . sodium chloride flush  3 mL Intravenous Q12H  . trimethoprim  100 mg Oral Daily   Continuous Infusions:   PRN Meds:.sodium chloride, acetaminophen **OR** acetaminophen, albuterol, ondansetron **OR** ondansetron (ZOFRAN) IV, sodium chloride flush   Data Review:   Micro Results Recent Results (from the past 240 hour(s))  Urine culture     Status: Abnormal   Collection Time: 03/10/16  3:53 PM  Result Value Ref Range Status   Specimen Description URINE, CLEAN CATCH  Final   Special Requests NONE  Final   Culture MULTIPLE SPECIES PRESENT, SUGGEST RECOLLECTION (A)  Final   Report Status 03/12/2016 FINAL  Final    Radiology Reports Dg Chest 2 View  Result Date: 03/10/2016 CLINICAL DATA:  Atrial fibrillation, recurrent urinary tract infection EXAM: CHEST  2 VIEW COMPARISON:  None. FINDINGS: There is mild cardiomegaly present with probable small effusions and mild pulmonary vascular congestion suggesting mild CHF. No focal pneumonia is seen. No bony abnormality is seen other than diffuse osteopenia. IMPRESSION: Possible mild CHF with cardiomegaly and small effusions with questionable mild pulmonary vascular congestion. Electronically Signed   By: Dwyane Dee M.D.   On: 03/10/2016 16:41   Dg Foot 2 Views Left  Result Date: 03/11/2016 CLINICAL DATA:  Fall.  Bruising and left foot pain. EXAM: LEFT FOOT - 2 VIEW COMPARISON:  None available. FINDINGS: Moderate osteopenia is present. No acute bone or soft tissue abnormality is present. A severe hallux valgus deformity is noted. Prominent enthesophytes are present at the second metatarsal IMPRESSION: 1. Severe hallux valgus deformity. 2. Osteopenia. 3. No acute bone or soft tissue abnormality. Electronically Signed   By:  Marin Robertshristopher  Mattern M.D.   On: 03/11/2016 11:38     CBC  Recent Labs Lab 03/10/16 1552  03/11/16 0224 03/12/16 0525 03/13/16 0413  WBC 6.9 7.1 5.2 4.6  HGB 13.9 11.8* 11.3* 12.8  HCT 40.7 34.0* 33.0* 36.6  PLT 131* 107* 93* 94*  MCV 97.4 96.8 96.0 95.4  MCH 33.2 33.5 33.0 33.3  MCHC 34.1 34.6 34.3 34.9  RDW 13.9 13.5 13.7 13.6    Chemistries   Recent Labs Lab 03/10/16 1552 03/10/16 2212 03/11/16 0224 03/12/16 0525  NA 137  --  137  --   K 4.1  --  3.9  --   CL 105  --  107  --   CO2 26  --  24  --   GLUCOSE 104*  --  106*  --   BUN 23*  --  16  --   CREATININE 1.09*  --  0.74 0.85  CALCIUM 10.4*  --  9.1  --   MG 1.9 1.8  --   --    ------------------------------------------------------------------------------------------------------------------ estimated creatinine clearance is 32.9 mL/min (by C-G formula based on SCr of 0.85 mg/dL). ------------------------------------------------------------------------------------------------------------------ No results for input(s): HGBA1C in the last 72 hours. ------------------------------------------------------------------------------------------------------------------ No results for input(s): CHOL, HDL, LDLCALC, TRIG, CHOLHDL, LDLDIRECT in the last 72 hours. ------------------------------------------------------------------------------------------------------------------ No results for input(s): TSH, T4TOTAL, T3FREE, THYROIDAB in the last 72 hours.  Invalid input(s): FREET3 ------------------------------------------------------------------------------------------------------------------ No results for input(s): VITAMINB12, FOLATE, FERRITIN, TIBC, IRON, RETICCTPCT in the last 72 hours.  Coagulation profile No results for input(s): INR, PROTIME in the last 168 hours.  No results for input(s): DDIMER in the last 72 hours.  Cardiac Enzymes  Recent Labs Lab 03/10/16 2212 03/11/16 0224 03/11/16 0933  TROPONINI <0.03 <0.03 <0.03    ------------------------------------------------------------------------------------------------------------------ Invalid input(s): POCBNP    Assessment & Plan  Patient is a 82 year old admitted with generalized weakness noted to have A. fib with RVR  #1 Atrial fibrillation with RVR, Controlled, continue  by mouth amiodarone. The patient is not good candidate for anticoagulation due to multiple falls at home. continue ASA and lovenox for now, add plavix on discharge, discontinued amiodarone drip and started  po 200 mg bid for 7 days most, then change to daily after discharge, add digoxin 0.125 mg daily po,  per Dr. Juliann Paresallwood.  #2 possible cystitis Discontinued ceftriaxone Started oral trimpex.  #3 Acute diastolic CHF, LV EF: 60% -   65%. Due to Afib with RVR. started lasix. But now her edema has decreased so we adjusted the dose of Lasix.  * Hypoxia. Resolved,  #4 history of seizures continue Keppra and Neurontin and Depakote  #5 left foot pain, improved. Pain control.  x-ray of her foot: Osteopenia.  #6 depression and anxiety continue Celexa  Weakness: PT. Recommends  SNF placement. Likely discharge to [facility] tomorrow morning.  Discussed with Dr. Juliann Paresallwood.       Code Status Orders        Start     Ordered   03/10/16 2020  Full code  Continuous     03/10/16 2019    Code Status History    Date Active Date Inactive Code Status Order ID Comments User Context   03/10/2016  8:19 PM 03/11/2016  7:53 AM Full Code [id number]  Sherry PollackQing Chen, MD Inpatient    Advance Directive Documentation   Flowsheet Row Most Recent Value  Type of Advance Directive  Living will  Pre-existing out of facility DNR order (yellow form or pink  MOST form)  No data  "MOST" Form in Place?  No data           Consultscards  DVT Prophylaxis Lovenox monitor with low platelets  Lab Results  Component Value Date   PLT 94 (L) 03/13/2016     Time Spent in minutes  35 minutes greater  than 50% of time spent in care coordination and counseling patient regarding the condition and plan of care. Discussed with her daughter.  Katha Hamming M.D on 03/15/2016 at 10:23 AM  Between 7am to 6pm - Pager - [phone]  After 6pm go to [email or web address] - password [email or web address] [facility]  [facility] [place] [facility]   Office  [phone]

## 2016-03-16 LAB — CREATININE, SERUM
CREATININE: 0.82 mg/dL (ref 0.44–1.00)
GFR calc Af Amer: >60 mL/min (ref >60)

## 2016-03-16 LAB — CBC
HCT: 34.8 % — ABNORMAL LOW (ref 35.0–47.0)
HEMOGLOBIN: 12.1 g/dL (ref 12.0–16.0)
MCH: 33.4 pg (ref 26.0–34.0)
MCHC: 34.7 g/dL (ref 32.0–36.0)
MCV: 96.3 fL (ref 80.0–100.0)
Platelets: 81 10*3/uL — ABNORMAL LOW (ref 150–440)
RBC: 3.61 MIL/uL — ABNORMAL LOW (ref 3.80–5.20)
RDW: 13.2 % (ref 11.5–14.5)
WBC: 3.6 10*3/uL (ref 3.6–11.0)

## 2016-03-16 MED ORDER — ENOXAPARIN SODIUM 30 MG/0.3ML ~~LOC~~ SOLN
30.0000 mg | SUBCUTANEOUS | Status: DC
  Administered 2016-03-17 – 2016-03-19 (×3): 30 mg via SUBCUTANEOUS
  Filled 2016-03-16 (×3): qty 0.3

## 2016-03-16 MED ORDER — METRONIDAZOLE 0.75 % VA GEL
1.0000 | Freq: Every day | VAGINAL | Status: DC
Start: 2016-03-16 — End: 2016-03-20
  Administered 2016-03-16 – 2016-03-19 (×4): 1 via VAGINAL
  Filled 2016-03-16: qty 70

## 2016-03-16 MED ORDER — CEPHALEXIN 500 MG PO CAPS
500.0000 mg | ORAL_CAPSULE | Freq: Two times a day (BID) | ORAL | Status: DC
  Administered 2016-03-16 – 2016-03-18 (×4): 500 mg via ORAL
  Filled 2016-03-16 (×4): qty 1

## 2016-03-16 MED ORDER — DIGOXIN 125 MCG PO TABS: 0.1250 mg | ORAL_TABLET | ORAL | Status: DC

## 2016-03-16 MED ORDER — AMIODARONE HCL 200 MG PO TABS
100.0000 mg | ORAL_TABLET | Freq: Two times a day (BID) | ORAL | Status: DC
  Administered 2016-03-17 (×2): 100 mg via ORAL
  Filled 2016-03-16 (×2): qty 1

## 2016-03-16 NOTE — Progress Notes (Signed)
Physical Therapy Treatment Patient Details Name: Sherry Crosby MRN: [id number] DOB: 05/28/1929 Today's Date: 03/16/2016    History of Present Illness presented to ER from PCP and admitted for management of Afib wit RVR.  Patient initially requiring cardizem/amioderone drip; now transitioned to PO.    PT Comments    Pt in bed agrees to session.  HR monitored and in low 60's throughout session.  Participated in exercises as described below.  Pt required max a x 1 for all bed mobility and to transition to/from sitting edge of bed.  Upon sitting at edge of bed pt noted to have tremors in BUE/LE which were not present in supine.  When asked, pt stated she was having an epileptic seizure.  She was coherent and verbal throughout with no cognition changes or other seizure symptoms besides there tremors.  She was able to carry on a conversation with me and stated she has these episodes about 2 times per week and that they cause her to fall at times.  She stated her neurologist is aware.  She was assisted to supine where tremors promptly stopped.  Discussed with primary nurse who went in to check pt.     Follow Up Recommendations  SNF     Equipment Recommendations       Recommendations for Other Services       Precautions / Restrictions Precautions Precautions: Fall Restrictions Weight Bearing Restrictions: No    Mobility  Bed Mobility Overal bed mobility: Needs Assistance Bed Mobility: Supine to Sit;Sit to Supine     Supine to sit: Max assist Sit to supine: Max assist      Transfers                    Ambulation/Gait                 Stairs            Wheelchair Mobility    Modified Rankin (Stroke Patients Only)       Balance Overall balance assessment: Needs assistance Sitting-balance support: Feet supported Sitting balance-Leahy Scale: Poor                              Cognition Arousal/Alertness: Awake/alert Behavior During  Therapy: WFL for tasks assessed/performed Overall Cognitive Status: Within Functional Limits for tasks assessed                      Exercises Other Exercises Other Exercises: Participated in supine AAROM for BLE's x 10 for SLR, hip/knee flexion, AB/ADD     General Comments        Pertinent Vitals/Pain Pain Assessment: No/denies pain    Home Living                      Prior Function            PT Goals (current goals can now be found in the care plan section)      Frequency    Min 2X/week      PT Plan Current plan remains appropriate    Co-evaluation             End of Session   Activity Tolerance: Patient limited by fatigue Patient left: in bed;with call bell/phone within reach;with bed alarm set     Time: [id number] PT Time Calculation (min) (ACUTE ONLY): 23 min  Charges:  $Therapeutic Exercise:  8-22 mins $Therapeutic Activity: 8-22 mins                    G Codes:      Danielle Dess March 18, 2016, 11:29 AM

## 2016-03-16 NOTE — Progress Notes (Signed)
Staff was made aware Physical therapist that patient was noted with bilateral upper and lower extremities tremor during therapy today. On assessment patient alert and able to carry on conversation with staff. No distress noted, no seizure activities noted. Dr. Luberta MutterKonidena made aware during rounds, nno. Will continue to monitor patient.

## 2016-03-16 NOTE — Progress Notes (Signed)
ANTICOAGULATION CONSULT NOTE - FOLLOW UP   Pharmacy Consult for enoxaparin  Indication: atrial fibrillation  No Known Allergies  Patient Measurements: Height: 5\' 1"  (154.9 cm) Weight: 95 lb (43.1 kg) IBW/kg (Calculated) : 47.8  Vital Signs: Temp: 97.9 F (36.6 C) (09/18 1123) Temp Source: Oral (09/18 1123) BP: 128/43 (09/18 1123) Pulse Rate: 58 (09/18 1123)  Labs:  Recent Labs  03/16/16 0522  HGB 12.1  HCT 34.8*  PLT 81*  CREATININE 0.82    Estimated Creatinine Clearance: 34.1 mL/min (by C-G formula based on SCr of 0.82 mg/dL).   Medical History: Past Medical History:  Diagnosis Date  . A-fib (HCC)   . Anxiety   . Depression   . High cholesterol   . Hx of degenerative disc disease   . Recurrent UTI   . Seizures [facility](HCC)    Assessment: 82 yo female admitted with A. Fib. Pharmacy consulted for enoxaparin dosing.   9/12: Plt: 131   Hgb: 13.9   Scr: 1.09 9/13: Plt: 107   Hgb: 11.8   Scr: 0.74  Current orders for Enoxaparin 45 mg SQ q12 hours.     Plan:  Per discussion with MD, plt trending down, not planning on full anticoagulation at d/c, per MD will change enoxaparin to 30 mg q24h (DVT ppx dosing, wt <45 kg).   Crist FatHannah Emmer Lillibridge, PharmD, BCPS Clinical Pharmacist 03/16/2016 3:04 PM

## 2016-03-16 NOTE — Clinical Social Work Note (Addendum)
MSW updated [facility] that patient would like to go to SNF for short term rehab.  MSW continuing to follow patient's progress throughout discharge planning.  Ervin KnackEric R. Asmara Backs, MSW [phone]  Mon-Fri 8a-4:30p 03/16/2016 11:50 AM

## 2016-03-16 NOTE — Progress Notes (Signed)
[facility] [place] [facility]                                                                                                                                                                                            Patient Demographics   Sherry HoardOzetta Crosby, is a 82 y.o. female, DOB - 1929-03-09, ZOX:[id number]  Admit date - 03/10/2016   Admitting Physician Shaune PollackQing Chen, MD  Outpatient Primary MD for the patient is RINGEL, Dillard EssexSARAH CORNWELL, MD   LOS - 6  Subjective;tremors when working with PT.HR in 50s.   Patient with a history of atrial fibrillation admitted with atrial fibrillation with rapid ventricular rate   Review of Systems:   CONSTITUTIONAL: No documented fever. No fatigue, Positive weakness. No weight gain, no weight loss.  EYES: No blurry or double vision.  ENT: No tinnitus. No postnasal drip. No redness of the oropharynx.  RESPIRATORY: No cough, no wheeze, no hemoptysis. No dyspnea.  CARDIOVASCULAR: No chest pain. No orthopnea. No palpitations. No syncope.  GASTROINTESTINAL: No nausea, no vomiting or diarrhea. No abdominal pain. No melena or hematochezia.  GENITOURINARY: No dysuria or hematuria.  ENDOCRINE: No polyuria or nocturia. No heat or cold intolerance.  HEMATOLOGY: No anemia. No bruising. No bleeding.  INTEGUMENTARY: No rashes. No lesions.  MUSCULOSKELETAL: No arthritis. No swelling. No gout. Left foot pain NEUROLOGIC: No numbness, tingling, or ataxia. No seizure-type activity.  PSYCHIATRIC: No anxiety. No insomnia. No ADD.    Vitals:   Vitals:   03/15/16 1948 03/16/16 0322 03/16/16 0750 03/16/16 1123  BP: (!) 126/48 (!) 126/51 (!) 133/57 (!) 128/43  Pulse: 63 (!) 58 (!) 57 (!) 58  Resp: 18 18  20   Temp: 98.2 F (36.8 C) 98 F (36.7 C) 98 F (36.7 C) 97.9 F (36.6 C)  TempSrc: Oral Oral Oral Oral  SpO2: 98% 100% 96% 98%  Weight:      Height:        Wt Readings from Last 3 Encounters:  03/10/16 43.1 kg (95 lb)  07/30/15 39.9  kg (88 lb)  05/12/15 39.9 kg (88 lb)     Intake/Output Summary (Last 24 hours) at 03/16/16 1257 Last data filed at 03/16/16 0900  Gross per 24 hour  Intake                0 ml  Output              650 ml  Net             -650 ml    Physical Exam:   GENERAL: Pleasant-appearing in no apparent distress.  HEAD, EYES, EARS, NOSE AND THROAT: Atraumatic, normocephalic. Extraocular muscles are intact. Pupils equal and reactive to light. Sclerae anicteric. No conjunctival injection. No oro-pharyngeal erythema.  NECK: Supple. There is no jugular venous distention. No bruits, no lymphadenopathy, no thyromegaly.  HEART: S1, S2,regular rate and rhythm, No murmurs, no rubs, no clicks.  LUNGS: Clear to auscultation bilaterally. No rales or rhonchi. No wheezes.  ABDOMEN: Soft, flat, nontender, nondistended. Has good bowel sounds. No hepatosplenomegaly appreciated.  EXTREMITIES: No evidence of any cyanosis, clubbing, or peripheral edema.  +2 pedal and radial pulses bilaterally left foot with some bruising. Power 2/5 in both legs. NEUROLOGIC: The patient is alert, awake, and oriented x3 with no focal motor or sensory deficits appreciated bilaterally.  SKIN: Moist and warm with no rashes appreciated.  Psych: Not anxious, depressed LN: No inguinal LN enlargement    Antibiotics   Anti-infectives    Start     Dose/Rate Route Frequency Ordered Stop   03/11/16 1000  trimethoprim (TRIMPEX) tablet 100 mg     100 mg Oral Daily 03/10/16 2019     03/10/16 2200  cefTRIAXone (ROCEPHIN) 1 g in dextrose 5 % 50 mL IVPB  Status:  Discontinued     1 g 100 mL/hr over 30 Minutes Intravenous Every 24 hours 03/10/16 2019 03/11/16 1256      Medications   Scheduled Meds: . amiodarone  200 mg Oral BID  . aspirin EC  81 mg Oral Daily  . citalopram  20 mg Oral Daily  . digoxin  0.125 mg Oral Daily  . divalproex  375 mg Oral BID  . enoxaparin (LOVENOX) injection  1 mg/kg Subcutaneous BID  . gabapentin  100 mg  Oral Daily  . levETIRAcetam  500 mg Oral BID  . miconazole  1 Applicatorful Vaginal QHS  . sodium chloride flush  3 mL Intravenous Q12H  . sodium chloride flush  3 mL Intravenous Q12H  . trimethoprim  100 mg Oral Daily   Continuous Infusions:   PRN Meds:.sodium chloride, acetaminophen **OR** acetaminophen, albuterol, ondansetron **OR** ondansetron (ZOFRAN) IV, sodium chloride flush   Data Review:   Micro Results Recent Results (from the past 240 hour(s))  Urine culture     Status: Abnormal   Collection Time: 03/10/16  3:53 PM  Result Value Ref Range Status   Specimen Description URINE, CLEAN CATCH  Final   Special Requests NONE  Final   Culture MULTIPLE SPECIES PRESENT, SUGGEST RECOLLECTION (A)  Final   Report Status 03/12/2016 FINAL  Final  Chlamydia/NGC rt PCR ([facility] only)     Status: None   Collection Time: 03/15/16  6:20 PM  Result Value Ref Range Status   Specimen source GC/Chlam ENDOCERVICAL  Final   Chlamydia Tr TEST CANCELLED DUE TO IMPROPER COLLECTION SDR NOT DETECTED Final  Chlamydia/NGC rt PCR ([facility] only)     Status: None   Collection Time: 03/15/16  6:55 PM  Result Value Ref Range Status   Specimen source GC/Chlam ENDOCERVICAL  Final   Chlamydia Tr NOT DETECTED NOT DETECTED Final   N gonorrhoeae NOT DETECTED NOT DETECTED Final    Comment: (NOTE) 100  This methodology has not been evaluated in pregnant women or in 200  patients with a history of hysterectomy. 300 400  This methodology will not be performed on patients less than 80  years of age.     Radiology Reports Dg Chest 2 View  Result Date: 03/10/2016 CLINICAL DATA:  Atrial fibrillation, recurrent urinary tract infection EXAM:  CHEST  2 VIEW COMPARISON:  None. FINDINGS: There is mild cardiomegaly present with probable small effusions and mild pulmonary vascular congestion suggesting mild CHF. No focal pneumonia is seen. No bony abnormality is seen other than diffuse osteopenia. IMPRESSION: Possible mild  CHF with cardiomegaly and small effusions with questionable mild pulmonary vascular congestion. Electronically Signed   By: Dwyane Dee M.D.   On: 03/10/2016 16:41   Dg Foot 2 Views Left  Result Date: 03/11/2016 CLINICAL DATA:  Fall.  Bruising and left foot pain. EXAM: LEFT FOOT - 2 VIEW COMPARISON:  None available. FINDINGS: Moderate osteopenia is present. No acute bone or soft tissue abnormality is present. A severe hallux valgus deformity is noted. Prominent enthesophytes are present at the second metatarsal IMPRESSION: 1. Severe hallux valgus deformity. 2. Osteopenia. 3. No acute bone or soft tissue abnormality. Electronically Signed   By: Marin Roberts M.D.   On: 03/11/2016 11:38     CBC  Recent Labs Lab 03/10/16 1552 03/11/16 0224 03/12/16 0525 03/13/16 0413 03/16/16 0522  WBC 6.9 7.1 5.2 4.6 3.6  HGB 13.9 11.8* 11.3* 12.8 12.1  HCT 40.7 34.0* 33.0* 36.6 34.8*  PLT 131* 107* 93* 94* 81*  MCV 97.4 96.8 96.0 95.4 96.3  MCH 33.2 33.5 33.0 33.3 33.4  MCHC 34.1 34.6 34.3 34.9 34.7  RDW 13.9 13.5 13.7 13.6 13.2    Chemistries   Recent Labs Lab 03/10/16 1552 03/10/16 2212 03/11/16 0224 03/12/16 0525 03/16/16 0522  NA 137  --  137  --   --   K 4.1  --  3.9  --   --   CL 105  --  107  --   --   CO2 26  --  24  --   --   GLUCOSE 104*  --  106*  --   --   BUN 23*  --  16  --   --   CREATININE 1.09*  --  0.74 0.85 0.82  CALCIUM 10.4*  --  9.1  --   --   MG 1.9 1.8  --   --   --    ------------------------------------------------------------------------------------------------------------------ estimated creatinine clearance is 34.1 mL/min (by C-G formula based on SCr of 0.82 mg/dL). ------------------------------------------------------------------------------------------------------------------ No results for input(s): HGBA1C in the last 72 hours. ------------------------------------------------------------------------------------------------------------------ No  results for input(s): CHOL, HDL, LDLCALC, TRIG, CHOLHDL, LDLDIRECT in the last 72 hours. ------------------------------------------------------------------------------------------------------------------ No results for input(s): TSH, T4TOTAL, T3FREE, THYROIDAB in the last 72 hours.  Invalid input(s): FREET3 ------------------------------------------------------------------------------------------------------------------ No results for input(s): VITAMINB12, FOLATE, FERRITIN, TIBC, IRON, RETICCTPCT in the last 72 hours.  Coagulation profile No results for input(s): INR, PROTIME in the last 168 hours.  No results for input(s): DDIMER in the last 72 hours.  Cardiac Enzymes  Recent Labs Lab 03/10/16 2212 03/11/16 0224 03/11/16 0933  TROPONINI <0.03 <0.03 <0.03   ------------------------------------------------------------------------------------------------------------------ Invalid input(s): POCBNP    Assessment & Plan  Patient is a 82 year old admitted with generalized weakness noted to have A. fib with RVR  #1 Atrial fibrillation with RVR, Controlled, continue  by mouth amiodarone. The patient is not good candidate for anticoagulation due to multiple falls at home. continue ASA and lovenox for now, add plavix on discharge,  Slight bradycardia: Decreased amiodarone 100  MG twice a day  #2 possible cystitis Discontinued ceftriaxone Started oral trimpex.  #3 Acute diastolic CHF, LV EF: 60% -   65%. Due to Afib with RVR. started lasix. But now her edema has decreased so we  adjusted the dose of Lasix.  * Hypoxia. Resolved,  #4 history of seizures continue Keppra and Neurontin and Depakote  #5 left foot pain, improved. Pain control.  x-ray of her foot: Osteopenia.  #6 depression and anxiety continue Celexa  Weakness: PT. Recommends  SNF placement. [facility]. tremors due to deconditioning; 7.vaginal discharge;lilkely bacterial  vaginosis;flagyl vaginal cream,vaginal cultures. Discussed with Dr. Juliann Pares.       Code Status Orders        Start     Ordered   03/10/16 2020  Full code  Continuous     03/10/16 2019    Code Status History    Date Active Date Inactive Code Status Order ID Comments User Context   03/10/2016  8:19 PM 03/11/2016  7:53 AM Full Code [id number]  Shaune Pollack, MD Inpatient    Advance Directive Documentation   Flowsheet Row Most Recent Value  Type of Advance Directive  Living will  Pre-existing out of facility DNR order (yellow form or pink MOST form)  No data  "MOST" Form in Place?  No data           Consultscards  DVT Prophylaxis Lovenox monitor with low platelets  Lab Results  Component Value Date   PLT 81 (L) 03/16/2016     Time Spent in minutes  35 minutes greater than 50% of time spent in care coordination and counseling patient regarding the condition and plan of care. Discussed with her daughter.  Katha Hamming M.D on 03/16/2016 at 12:57 PM  Between 7am to 6pm - Pager - [phone]  After 6pm go to [email or web address] - password [email or web address] [facility]  [facility]   Office  [phone]

## 2016-03-17 LAB — CBC
HCT: 34.6 % — ABNORMAL LOW (ref 35.0–47.0)
Hemoglobin: 11.8 g/dL — ABNORMAL LOW (ref 12.0–16.0)
MCH: 32.4 pg (ref 26.0–34.0)
MCHC: 34.2 g/dL (ref 32.0–36.0)
MCV: 94.8 fL (ref 80.0–100.0)
PLATELETS: 78 10*3/uL — AB (ref 150–440)
RBC: 3.65 MIL/uL — AB (ref 3.80–5.20)
RDW: 12.7 % (ref 11.5–14.5)
WBC: 4 10*3/uL (ref 3.6–11.0)

## 2016-03-17 LAB — BASIC METABOLIC PANEL
Anion gap: 2 — ABNORMAL LOW (ref 5–15)
BUN: 18 mg/dL (ref 6–20)
CALCIUM: 9.4 mg/dL (ref 8.9–10.3)
CO2: 30 mmol/L (ref 22–32)
CREATININE: 0.82 mg/dL (ref 0.44–1.00)
Chloride: 104 mmol/L (ref 101–111)
GFR calc non Af Amer: >60 mL/min (ref >60)
Glucose, Bld: 91 mg/dL (ref 65–99)
Potassium: 4.2 mmol/L (ref 3.5–5.1)
SODIUM: 136 mmol/L (ref 135–145)

## 2016-03-17 LAB — CHLAMYDIA/NGC RT PCR (ARMC ONLY)
CHLAMYDIA TR: NOT DETECTED
N GONORRHOEAE: NOT DETECTED

## 2016-03-17 MED ORDER — METOPROLOL TARTRATE 25 MG PO TABS
25.0000 mg | ORAL_TABLET | Freq: Two times a day (BID) | ORAL | Status: DC
  Administered 2016-03-17: 25 mg via ORAL
  Filled 2016-03-17: qty 1

## 2016-03-17 NOTE — Progress Notes (Signed)
Pt heart rate was 150 at 06:45. HR had been in the 50's all night on monitor. Amiodarone 100 mg given PO. Will continue to monitor.

## 2016-03-17 NOTE — Progress Notes (Signed)
Checked to see if pre-authorization would be needed for non-emergent EMS transport. Per [employer] benefits obtained online through [employer], patient has a [employer] Medicare Advantage PPO policy.  Medicare PPO plans do not require pre-auth for non-emergent ground transports using service codes A0426 or A0428.   

## 2016-03-17 NOTE — Progress Notes (Signed)
[facility]  BID, give first dose now.

## 2016-03-17 NOTE — Progress Notes (Signed)
[facility] - [place] at [facility]                                                                                                                                                                                            Patient Demographics   Sherry Crosby, is a 82 y.o. female, DOB - 1928/07/26, ZOX:[id number]  Admit date - 03/10/2016   Admitting Physician Shaune Pollack, MD  Outpatient Primary MD for the patient is RINGEL, Dillard Essex, MD   LOS - 7  Subjective The patient heart rate up to 131 today morning. Received  metoprolol 25 mg twice a day. Heart rate dropped to 40s. She  is feeling weak today. Patient amiodarone dose has been decreased yesterday because heart rate was in 50s, digoxin dose also a change it to alternate days. Review of Systems:   CONSTITUTIONAL: No documented fever. No fatigue, Positive weakness. No weight gain, no weight loss.  EYES: No blurry or double vision.  ENT: No tinnitus. No postnasal drip. No redness of the oropharynx.  RESPIRATORY: No cough, no wheeze, no hemoptysis. No dyspnea.  CARDIOVASCULAR: No chest pain. No orthopnea. No palpitations. No syncope.  GASTROINTESTINAL: No nausea, no vomiting or diarrhea. No abdominal pain. No melena or hematochezia.  GENITOURINARY: No dysuria or hematuria.  ENDOCRINE: No polyuria or nocturia. No heat or cold intolerance.  HEMATOLOGY: No anemia. No bruising. No bleeding.  INTEGUMENTARY: No rashes. No lesions.  MUSCULOSKELETAL: No arthritis. No swelling. No gout. Left foot pain NEUROLOGIC: No numbness, tingling, or ataxia. No seizure-type activity.  PSYCHIATRIC: No anxiety. No insomnia. No ADD.    Vitals:   Vitals:   03/17/16 0818 03/17/16 1001 03/17/16 1210 03/17/16 1210  BP: 130/72 (!) 114/52 (!) 122/37   Pulse: (!) 122 64 (!) 50 (!) 49  Resp:   16   Temp: 98.9 F (37.2 C)     TempSrc: Oral     SpO2: 96%  96% 97%  Weight:      Height:        Wt Readings from Last 3 Encounters:   03/10/16 43.1 kg (95 lb)  07/30/15 39.9 kg (88 lb)  05/12/15 39.9 kg (88 lb)     Intake/Output Summary (Last 24 hours) at 03/17/16 1449 Last data filed at 03/17/16 1330  Gross per 24 hour  Intake              123 ml  Output              550 ml  Net             -427 ml  Physical Exam:   GENERAL: Pleasant-appearing in no apparent distress.  HEAD, EYES, EARS, NOSE AND THROAT: Atraumatic, normocephalic. Extraocular muscles are intact. Pupils equal and reactive to light. Sclerae anicteric. No conjunctival injection. No oro-pharyngeal erythema.  NECK: Supple. There is no jugular venous distention. No bruits, no lymphadenopathy, no thyromegaly.  HEART: S1, S2,regular rate and rhythm, No murmurs, no rubs, no clicks.  LUNGS: Clear to auscultation bilaterally. No rales or rhonchi. No wheezes.  ABDOMEN: Soft, flat, nontender, nondistended. Has good bowel sounds. No hepatosplenomegaly appreciated.  EXTREMITIES: No evidence of any cyanosis, clubbing, or peripheral edema.  +2 pedal and radial pulses bilaterally left foot with some bruising. Power 2/5 in both legs. NEUROLOGIC: The patient is alert, awake, and oriented x3 with no focal motor or sensory deficits appreciated bilaterally.  SKIN: Moist and warm with no rashes appreciated.  Psych: Not anxious, depressed LN: No inguinal LN enlargement    Antibiotics   Anti-infectives    Start     Dose/Rate Route Frequency Ordered Stop   03/16/16 1600  cephALEXin (KEFLEX) capsule 500 mg     500 mg Oral Every 12 hours 03/16/16 1510     03/11/16 1000  trimethoprim (TRIMPEX) tablet 100 mg  Status:  Discontinued     100 mg Oral Daily 03/10/16 2019 03/16/16 1510   03/10/16 2200  cefTRIAXone (ROCEPHIN) 1 g in dextrose 5 % 50 mL IVPB  Status:  Discontinued     1 g 100 mL/hr over 30 Minutes Intravenous Every 24 hours 03/10/16 2019 03/11/16 1256      Medications   Scheduled Meds: . aspirin EC  81 mg Oral Daily  . cephALEXin  500 mg Oral Q12H  .  divalproex  375 mg Oral BID  . enoxaparin (LOVENOX) injection  30 mg Subcutaneous Q24H  . gabapentin  100 mg Oral Daily  . levETIRAcetam  500 mg Oral BID  . metoprolol tartrate  25 mg Oral BID  . metroNIDAZOLE  1 Applicatorful Vaginal QHS  . sodium chloride flush  3 mL Intravenous Q12H  . sodium chloride flush  3 mL Intravenous Q12H   Continuous Infusions:   PRN Meds:.sodium chloride, acetaminophen **OR** acetaminophen, albuterol, ondansetron **OR** ondansetron (ZOFRAN) IV, sodium chloride flush   Data Review:   Micro Results Recent Results (from the past 240 hour(s))  Urine culture     Status: Abnormal   Collection Time: 03/10/16  3:53 PM  Result Value Ref Range Status   Specimen Description URINE, CLEAN CATCH  Final   Special Requests NONE  Final   Culture MULTIPLE SPECIES PRESENT, SUGGEST RECOLLECTION (A)  Final   Report Status 03/12/2016 FINAL  Final  Chlamydia/NGC rt PCR ([facility] only)     Status: None   Collection Time: 03/15/16  6:20 PM  Result Value Ref Range Status   Specimen source GC/Chlam ENDOCERVICAL  Final   Chlamydia Tr TEST CANCELLED DUE TO IMPROPER COLLECTION SDR NOT DETECTED Final  Chlamydia/NGC rt PCR ([facility] only)     Status: None   Collection Time: 03/15/16  6:55 PM  Result Value Ref Range Status   Specimen source GC/Chlam ENDOCERVICAL  Final   Chlamydia Tr NOT DETECTED NOT DETECTED Final   N gonorrhoeae NOT DETECTED NOT DETECTED Final    Comment: (NOTE) 100  This methodology has not been evaluated in pregnant women or in 200  patients with a history of hysterectomy. 300 400  This methodology will not be performed on patients less than 66  years of age.  Chlamydia/NGC rt PCR ([facility] only)     Status: None   Collection Time: 03/17/16  4:38 AM  Result Value Ref Range Status   Specimen source GC/Chlam URINE, RANDOM  Final   Chlamydia Tr NOT DETECTED NOT DETECTED Final   N gonorrhoeae NOT DETECTED NOT DETECTED Final    Comment: (NOTE) 100  This methodology  has not been evaluated in pregnant women or in 200  patients with a history of hysterectomy. 300 400  This methodology will not be performed on patients less than 5414  years of age.     Radiology Reports Dg Chest 2 View  Result Date: 03/10/2016 CLINICAL DATA:  Atrial fibrillation, recurrent urinary tract infection EXAM: CHEST  2 VIEW COMPARISON:  None. FINDINGS: There is mild cardiomegaly present with probable small effusions and mild pulmonary vascular congestion suggesting mild CHF. No focal pneumonia is seen. No bony abnormality is seen other than diffuse osteopenia. IMPRESSION: Possible mild CHF with cardiomegaly and small effusions with questionable mild pulmonary vascular congestion. Electronically Signed   By: Dwyane DeePaul  Barry M.D.   On: 03/10/2016 16:41   Dg Foot 2 Views Left  Result Date: 03/11/2016 CLINICAL DATA:  Fall.  Bruising and left foot pain. EXAM: LEFT FOOT - 2 VIEW COMPARISON:  None available. FINDINGS: Moderate osteopenia is present. No acute bone or soft tissue abnormality is present. A severe hallux valgus deformity is noted. Prominent enthesophytes are present at the second metatarsal IMPRESSION: 1. Severe hallux valgus deformity. 2. Osteopenia. 3. No acute bone or soft tissue abnormality. Electronically Signed   By: Marin Robertshristopher  Mattern M.D.   On: 03/11/2016 11:38     CBC  Recent Labs Lab 03/11/16 0224 03/12/16 0525 03/13/16 0413 03/16/16 0522 03/17/16 0500  WBC 7.1 5.2 4.6 3.6 4.0  HGB 11.8* 11.3* 12.8 12.1 11.8*  HCT 34.0* 33.0* 36.6 34.8* 34.6*  PLT 107* 93* 94* 81* 78*  MCV 96.8 96.0 95.4 96.3 94.8  MCH 33.5 33.0 33.3 33.4 32.4  MCHC 34.6 34.3 34.9 34.7 34.2  RDW 13.5 13.7 13.6 13.2 12.7    Chemistries   Recent Labs Lab 03/10/16 1552 03/10/16 2212 03/11/16 0224 03/12/16 0525 03/16/16 0522 03/17/16 0500  NA 137  --  137  --   --  136  K 4.1  --  3.9  --   --  4.2  CL 105  --  107  --   --  104  CO2 26  --  24  --   --  30  GLUCOSE 104*  --  106*   --   --  91  BUN 23*  --  16  --   --  18  CREATININE 1.09*  --  0.74 0.85 0.82 0.82  CALCIUM 10.4*  --  9.1  --   --  9.4  MG 1.9 1.8  --   --   --   --    ------------------------------------------------------------------------------------------------------------------ estimated creatinine clearance is 34.1 mL/min (by C-G formula based on SCr of 0.82 mg/dL). ------------------------------------------------------------------------------------------------------------------ No results for input(s): HGBA1C in the last 72 hours. ------------------------------------------------------------------------------------------------------------------ No results for input(s): CHOL, HDL, LDLCALC, TRIG, CHOLHDL, LDLDIRECT in the last 72 hours. ------------------------------------------------------------------------------------------------------------------ No results for input(s): TSH, T4TOTAL, T3FREE, THYROIDAB in the last 72 hours.  Invalid input(s): FREET3 ------------------------------------------------------------------------------------------------------------------ No results for input(s): VITAMINB12, FOLATE, FERRITIN, TIBC, IRON, RETICCTPCT in the last 72 hours.  Coagulation profile No results for input(s): INR, PROTIME in the last 168 hours.  No results for input(s): DDIMER in  the last 72 hours.  Cardiac Enzymes  Recent Labs Lab 03/10/16 2212 03/11/16 0224 03/11/16 0933  TROPONINI <0.03 <0.03 <0.03   ------------------------------------------------------------------------------------------------------------------ Invalid input(s): POCBNP    Assessment & Plan  Patient is a 82 year old admitted with generalized weakness noted to have A. fib with RVR  #1 Atrial fibrillation With tachy bradycardia syndrome. Appreciate cardiology following the patient.  Hold beta blockers, amiodarone, digoxin because of bradycardia that happened after giving 1 dose of 25 mg po  metoprolol this  morning.  #2 possible cystitis Discontinued ceftriaxone Changed to Keflex.  #3 Acute diastolic CHF, LV EF: 60% -   65%. Due to Afib with RVR. started lasix. But now her edema has decreased . Hold the Lasix as she looks euvolemic.  * Hypoxia. Resolved,  #4 history of seizures continue Keppra and Neurontin and Depakote  #5 left foot pain, improved. Pain control.  x-ray of her foot: Osteopenia.  #6 depression and anxiety continue Celexa  Weakness: PT. Recommends  SNF placement.  tremors due to deconditioning; 7.vaginal discharge;lilkely bacterial vaginosis;flagyl vaginal cream,vaginal cultures.  APPRECIATE  PRESENCE OF  RN DURING ROUNDS.       Code Status Orders        Start     Ordered   03/10/16 2020  Full code  Continuous     03/10/16 2019    Code Status History    Date Active Date Inactive Code Status Order ID Comments User Context   03/10/2016  8:19 PM 03/11/2016  7:53 AM Full Code [id number]  Shaune Pollack, MD Inpatient    Advance Directive Documentation   Flowsheet Row Most Recent Value  Type of Advance Directive  Living will  Pre-existing out of facility DNR order (yellow form or pink MOST form)  No data  "MOST" Form in Place?  No data           Consultscards  DVT Prophylaxis Lovenox monitor with low platelets  Lab Results  Component Value Date   PLT 78 (L) 03/17/2016     Time Spent in minutes  35 minutes greater than 50% of time spent in care coordination and counseling patient regarding the condition and plan of care. Discussed with her daughter.  Katha Hamming M.D on 03/17/2016 at 2:49 PM  Between 7am to 6pm - Pager - [phone]  After 6pm go to [email or web address] - password [email or web address] [facility]  [facility] Hospitalists   Office  [phone]

## 2016-03-17 NOTE — Progress Notes (Signed)
Physical Therapy Treatment Patient Details Name: Sherry Crosby MRN: [id number] DOB: 14-Jan-1929 Today's Date: 03/17/2016    History of Present Illness presented to ER from PCP and admitted for management of Afib wit RVR.  Patient initially requiring cardizem/amioderone drip; now transitioned to PO.    PT Comments    Discussed with primary nurse prior to session.  OK for therapy.  Pt in bed this am with HR in 80-90's at rest. Participated in exercises as described below.  HR 100'-110's with supine activity.  Pt sleepy today needing frequent verbal cues and conversation to remain awake and engaged in session.  Declined sitting at edge of bed or other mobility today.   Follow Up Recommendations  SNF     Equipment Recommendations       Recommendations for Other Services       Precautions / Restrictions Precautions Precautions: Fall Restrictions Weight Bearing Restrictions: No    Mobility  Bed Mobility               General bed mobility comments: declined  Transfers                 General transfer comment: declined  Ambulation/Gait                 Stairs            Wheelchair Mobility    Modified Rankin (Stroke Patients Only)       Balance                                    Cognition Arousal/Alertness: Lethargic Behavior During Therapy: WFL for tasks assessed/performed Overall Cognitive Status: Within Functional Limits for tasks assessed                      Exercises Other Exercises Other Exercises: Participated in supine AAROM for BLE's x 10 for SLR, hip/knee flexion, AB/ADD     General Comments        Pertinent Vitals/Pain Pain Assessment: No/denies pain    Home Living                      Prior Function            PT Goals (current goals can now be found in the care plan section)      Frequency    Min 2X/week      PT Plan Current plan remains appropriate     Co-evaluation             End of Session   Activity Tolerance: Patient limited by fatigue;Patient limited by lethargy Patient left: in bed;with call bell/phone within reach;with bed alarm set     Time: 1000-1017 PT Time Calculation (min) (ACUTE ONLY): 17 min  Charges:  $Therapeutic Exercise: 8-22 mins                    G Codes:      Danielle DessSarah Vienne Corcoran 03/17/2016, 10:27 AM

## 2016-03-17 NOTE — Progress Notes (Signed)
Amiodarone 100 mg withheld due to pt heart rate at 51 on the monitor. MD made aware, established parameter to hold amiodarone if heart rate less than 55.

## 2016-03-17 NOTE — Clinical Social Work Note (Signed)
MSW contacted patient's son Minerva Areolaric to let him know that [facility] can not accept patient anymore.  MSW presented other bed offers to patient's family [facility].  MSW contacted SNF and they can accept patient once she is medically ready for discharge and orders have been received.  MSW to continue to follow patient's progress throughout discharge planning.  Ervin KnackEric R. Devere Brem, MSW [phone]  Mon-Fri 8a-4:30p 03/17/2016 9:40 AM

## 2016-03-17 NOTE — Progress Notes (Signed)
Paged Dr.Callwood X2. No response. Heart rate is currently at 131.

## 2016-03-18 MED ORDER — DOCUSATE SODIUM 100 MG PO CAPS
100.0000 mg | ORAL_CAPSULE | Freq: Two times a day (BID) | ORAL | Status: DC
  Administered 2016-03-18 – 2016-03-20 (×4): 100 mg via ORAL
  Filled 2016-03-18 (×5): qty 1

## 2016-03-18 NOTE — Progress Notes (Signed)
[facility] [place] [facility]                                                                                                                                                                                            Patient Demographics   Sherry HoardOzetta Crosby, is a 82 y.o. female, DOB - Dec 09, 1928, WUJ:[id number]  Admit date - 03/10/2016   Admitting Physician Shaune PollackQing Chen, MD  Outpatient Primary MD for the patient is RINGEL, Dillard EssexSARAH CORNWELL, MD   LOS - 8  Subjective Still bradycardic,However she is more alert, watching TV, having some breakfast. She denies any chest pain or shortness of breath. Review of Systems:   CONSTITUTIONAL: No documented fever. No fatigue, Positive weakness. No weight gain, no weight loss.  EYES: No blurry or double vision.  ENT: No tinnitus. No postnasal drip. No redness of the oropharynx.  RESPIRATORY: No cough, no wheeze, no hemoptysis. No dyspnea.  CARDIOVASCULAR: No chest pain. No orthopnea. No palpitations. No syncope.  GASTROINTESTINAL: No nausea, no vomiting or diarrhea. No abdominal pain. No melena or hematochezia.  GENITOURINARY: No dysuria or hematuria.  ENDOCRINE: No polyuria or nocturia. No heat or cold intolerance.  HEMATOLOGY: No anemia. No bruising. No bleeding.  INTEGUMENTARY: No rashes. No lesions.  MUSCULOSKELETAL: No arthritis. No swelling. No gout. Left foot pain NEUROLOGIC: No numbness, tingling, or ataxia. No seizure-type activity.  PSYCHIATRIC: No anxiety. No insomnia. No ADD.    Vitals:   Vitals:   03/17/16 1751 03/17/16 1950 03/18/16 0533 03/18/16 0756  BP:  (!) 113/46 140/62 (!) 136/53  Pulse: (!) 55 (!) 52 (!) 56 (!) 58  Resp:  16 18   Temp:  98.4 F (36.9 C) 99.1 F (37.3 C) 98.5 F (36.9 C)  TempSrc:  Oral Oral Oral  SpO2:  99% 98% 98%  Weight:      Height:        Wt Readings from Last 3 Encounters:  03/10/16 43.1 kg (95 lb)  07/30/15 39.9 kg (88 lb)  05/12/15 39.9 kg (88 lb)      Intake/Output Summary (Last 24 hours) at 03/18/16 1132 Last data filed at 03/18/16 0900  Gross per 24 hour  Intake                3 ml  Output              800 ml  Net             -797 ml    Physical Exam:   GENERAL: Pleasant-appearing in no apparent distress.  HEAD, EYES, EARS, NOSE AND THROAT: Atraumatic, normocephalic.  Extraocular muscles are intact. Pupils equal and reactive to light. Sclerae anicteric. No conjunctival injection. No oro-pharyngeal erythema.  NECK: Supple. There is no jugular venous distention. No bruits, no lymphadenopathy, no thyromegaly.  HEART: S1, S2,regular rate and rhythm, No murmurs, no rubs, no clicks.  LUNGS: Clear to auscultation bilaterally. No rales or rhonchi. No wheezes.  ABDOMEN: Soft, flat, nontender, nondistended. Has good bowel sounds. No hepatosplenomegaly appreciated.  EXTREMITIES: No evidence of any cyanosis, clubbing, or peripheral edema.  +2 pedal and radial pulses bilaterally left foot with some bruising. Power 2/5 in both legs. NEUROLOGIC: The patient is alert, awake, and oriented x3 with no focal motor or sensory deficits appreciated bilaterally.  SKIN: Moist and warm with no rashes appreciated.  Psych: Not anxious, depressed LN: No inguinal LN enlargement    Antibiotics   Anti-infectives    Start     Dose/Rate Route Frequency Ordered Stop   03/16/16 1600  cephALEXin (KEFLEX) capsule 500 mg     500 mg Oral Every 12 hours 03/16/16 1510     03/11/16 1000  trimethoprim (TRIMPEX) tablet 100 mg  Status:  Discontinued     100 mg Oral Daily 03/10/16 2019 03/16/16 1510   03/10/16 2200  cefTRIAXone (ROCEPHIN) 1 g in dextrose 5 % 50 mL IVPB  Status:  Discontinued     1 g 100 mL/hr over 30 Minutes Intravenous Every 24 hours 03/10/16 2019 03/11/16 1256      Medications   Scheduled Meds: . aspirin EC  81 mg Oral Daily  . cephALEXin  500 mg Oral Q12H  . divalproex  375 mg Oral BID  . enoxaparin (LOVENOX) injection  30 mg Subcutaneous  Q24H  . gabapentin  100 mg Oral Daily  . levETIRAcetam  500 mg Oral BID  . metroNIDAZOLE  1 Applicatorful Vaginal QHS  . sodium chloride flush  3 mL Intravenous Q12H  . sodium chloride flush  3 mL Intravenous Q12H   Continuous Infusions:   PRN Meds:.sodium chloride, acetaminophen **OR** acetaminophen, albuterol, ondansetron **OR** ondansetron (ZOFRAN) IV, sodium chloride flush   Data Review:   Micro Results Recent Results (from the past 240 hour(s))  Urine culture     Status: Abnormal   Collection Time: 03/10/16  3:53 PM  Result Value Ref Range Status   Specimen Description URINE, CLEAN CATCH  Final   Special Requests NONE  Final   Culture MULTIPLE SPECIES PRESENT, SUGGEST RECOLLECTION (A)  Final   Report Status 03/12/2016 FINAL  Final  Chlamydia/NGC rt PCR ([facility] only)     Status: None   Collection Time: 03/15/16  6:20 PM  Result Value Ref Range Status   Specimen source GC/Chlam ENDOCERVICAL  Final   Chlamydia Tr TEST CANCELLED DUE TO IMPROPER COLLECTION SDR NOT DETECTED Final  Chlamydia/NGC rt PCR ([facility] only)     Status: None   Collection Time: 03/15/16  6:55 PM  Result Value Ref Range Status   Specimen source GC/Chlam ENDOCERVICAL  Final   Chlamydia Tr NOT DETECTED NOT DETECTED Final   N gonorrhoeae NOT DETECTED NOT DETECTED Final    Comment: (NOTE) 100  This methodology has not been evaluated in pregnant women or in 200  patients with a history of hysterectomy. 300 400  This methodology will not be performed on patients less than 37  years of age.   Chlamydia/NGC rt PCR ([facility] only)     Status: None   Collection Time: 03/17/16  4:38 AM  Result Value Ref Range Status  Specimen source GC/Chlam URINE, RANDOM  Final   Chlamydia Tr NOT DETECTED NOT DETECTED Final   N gonorrhoeae NOT DETECTED NOT DETECTED Final    Comment: (NOTE) 100  This methodology has not been evaluated in pregnant women or in 200  patients with a history of hysterectomy. 300 400  This methodology  will not be performed on patients less than 32  years of age.     Radiology Reports Dg Chest 2 View  Result Date: 03/10/2016 CLINICAL DATA:  Atrial fibrillation, recurrent urinary tract infection EXAM: CHEST  2 VIEW COMPARISON:  None. FINDINGS: There is mild cardiomegaly present with probable small effusions and mild pulmonary vascular congestion suggesting mild CHF. No focal pneumonia is seen. No bony abnormality is seen other than diffuse osteopenia. IMPRESSION: Possible mild CHF with cardiomegaly and small effusions with questionable mild pulmonary vascular congestion. Electronically Signed   By: Dwyane Dee M.D.   On: 03/10/2016 16:41   Dg Foot 2 Views Left  Result Date: 03/11/2016 CLINICAL DATA:  Fall.  Bruising and left foot pain. EXAM: LEFT FOOT - 2 VIEW COMPARISON:  None available. FINDINGS: Moderate osteopenia is present. No acute bone or soft tissue abnormality is present. A severe hallux valgus deformity is noted. Prominent enthesophytes are present at the second metatarsal IMPRESSION: 1. Severe hallux valgus deformity. 2. Osteopenia. 3. No acute bone or soft tissue abnormality. Electronically Signed   By: Marin Roberts M.D.   On: 03/11/2016 11:38     CBC  Recent Labs Lab 03/12/16 0525 03/13/16 0413 03/16/16 0522 03/17/16 0500  WBC 5.2 4.6 3.6 4.0  HGB 11.3* 12.8 12.1 11.8*  HCT 33.0* 36.6 34.8* 34.6*  PLT 93* 94* 81* 78*  MCV 96.0 95.4 96.3 94.8  MCH 33.0 33.3 33.4 32.4  MCHC 34.3 34.9 34.7 34.2  RDW 13.7 13.6 13.2 12.7    Chemistries   Recent Labs Lab 03/12/16 0525 03/16/16 0522 03/17/16 0500  NA  --   --  136  K  --   --  4.2  CL  --   --  104  CO2  --   --  30  GLUCOSE  --   --  91  BUN  --   --  18  CREATININE 0.85 0.82 0.82  CALCIUM  --   --  9.4   ------------------------------------------------------------------------------------------------------------------ estimated creatinine clearance is 34.1 mL/min (by C-G formula based on SCr of 0.82  mg/dL). ------------------------------------------------------------------------------------------------------------------ No results for input(s): HGBA1C in the last 72 hours. ------------------------------------------------------------------------------------------------------------------ No results for input(s): CHOL, HDL, LDLCALC, TRIG, CHOLHDL, LDLDIRECT in the last 72 hours. ------------------------------------------------------------------------------------------------------------------ No results for input(s): TSH, T4TOTAL, T3FREE, THYROIDAB in the last 72 hours.  Invalid input(s): FREET3 ------------------------------------------------------------------------------------------------------------------ No results for input(s): VITAMINB12, FOLATE, FERRITIN, TIBC, IRON, RETICCTPCT in the last 72 hours.  Coagulation profile No results for input(s): INR, PROTIME in the last 168 hours.  No results for input(s): DDIMER in the last 72 hours.  Cardiac Enzymes No results for input(s): CKMB, TROPONINI, MYOGLOBIN in the last 168 hours.  Invalid input(s): CK ------------------------------------------------------------------------------------------------------------------ Invalid input(s): POCBNP    Assessment & Plan  Patient is a 82 year old admitted with generalized weakness noted to have A. fib with RVR  #1 Atrial fibrillation With tachy bradycardia syndrome. Appreciate cardiology following the patient. Hold all rate controlling medications.  HR  Is slowly improving. It was in 40s yesterday but today it is in the upper 50s.  #2 possible cystitis Discontinued ceftriaxone Changed to Keflex.  #3 Acute diastolic  CHF, LV EF: 60% -   65%. Due to Afib with RVR. started lasix. But now her edema has decreased . Hold the Lasix as she looks euvolemic.  * Hypoxia. Resolved,  #4 history of seizures continue Keppra and Neurontin and Depakote  #5 left foot pain, improved. Pain control.   x-ray of her foot: Osteopenia.  #6 depression and anxiety continue Celexa  Weakness: PT. Recommends  SNF placement. Family prefers [facility] home of [address] tremors due to deconditioning; 7.vaginal discharge;lilkely bacterial vaginosis;flagyl vaginal. cream,improving  APPRECIATE  PRESENCE OF  RN DURING ROUNDS.       Code Status Orders        Start     Ordered   03/10/16 2020  Full code  Continuous     03/10/16 2019    Code Status History    Date Active Date Inactive Code Status Order ID Comments User Context   03/10/2016  8:19 PM 03/11/2016  7:53 AM Full Code [id number]  Shaune Pollack, MD Inpatient    Advance Directive Documentation   Flowsheet Row Most Recent Value  Type of Advance Directive  Living will  Pre-existing out of facility DNR order (yellow form or pink MOST form)  No data  "MOST" Form in Place?  No data           Consultscards  DVT Prophylaxis Lovenox monitor with low platelets  Lab Results  Component Value Date   PLT 78 (L) 03/17/2016     Time Spent in minutes  35 minutes greater than 50% of time spent in care coordination and counseling patient regarding the condition and plan of care.   Katha Hamming M.D on 03/18/2016 at 11:32 AM  Between 7am to 6pm - Pager - [phone]  After 6pm go to [email or web address] - password [email or web address] [facility]  [facility]   Office  [phone]

## 2016-03-18 NOTE — Progress Notes (Signed)
Physical Therapy Treatment Patient Details Name: Sherry Crosby MRN: [id number] DOB: 1929/01/10 Today's Date: 03/18/2016    History of Present Illness presented to ER from PCP and admitted for management of Afib wit RVR.  Patient initially requiring cardizem/amioderone drip; now transitioned to PO.    PT Comments    Pt in bed, agrees to session.  Supine to/from edge of bed with mod a x 1.  Max a to reposition for comfort.  Sitting edge of bed with min assist for seated AROM.  She was able to sit for 5 minutes with assist.  Unsafe to be left sitting edge of bed unattended.  She requested to use commode at bedside and was able to transfer with mod/max assist to/from commode.  HR monitored throughout session.  High 50's to low 60's.   Improved activity tolerance today.   Follow Up Recommendations  SNF     Equipment Recommendations       Recommendations for Other Services       Precautions / Restrictions Precautions Precautions: Fall Restrictions Weight Bearing Restrictions: No    Mobility  Bed Mobility Overal bed mobility: Needs Assistance Bed Mobility: Sit to Supine;Supine to Sit     Supine to sit: Mod assist Sit to supine: Mod assist      Transfers Overall transfer level: Needs assistance Equipment used: None Transfers: Stand Pivot Transfers Sit to Stand: Mod assist;Max assist Stand pivot transfers: Mod assist;Max assist          Ambulation/Gait                 Stairs            Wheelchair Mobility    Modified Rankin (Stroke Patients Only)       Balance Overall balance assessment: Needs assistance Sitting-balance support: Feet supported Sitting balance-Leahy Scale: Poor     Standing balance support: Bilateral upper extremity supported Standing balance-Leahy Scale: Zero                      Cognition Arousal/Alertness: Awake/alert Behavior During Therapy: Flat affect;WFL for tasks assessed/performed Overall Cognitive  Status: Within Functional Limits for tasks assessed                      Exercises Other Exercises Other Exercises: seated AROM LAQ, marches, ankle pumps    General Comments        Pertinent Vitals/Pain Pain Assessment: No/denies pain    Home Living                      Prior Function            PT Goals (current goals can now be found in the care plan section)      Frequency    Min 2X/week      PT Plan Current plan remains appropriate    Co-evaluation             End of Session Equipment Utilized During Treatment: Gait belt Activity Tolerance: Patient limited by fatigue Patient left: in bed;with call bell/phone within reach;with bed alarm set     Time: [id number] PT Time Calculation (min) (ACUTE ONLY): 12 min  Charges:  $Therapeutic Exercise: 8-22 mins                    G Codes:      Danielle DessSarah Tameca Jerez 03/18/2016, 12:09 PM

## 2016-03-19 MED ORDER — METOPROLOL SUCCINATE ER 25 MG PO TB24
12.5000 mg | ORAL_TABLET | Freq: Every day | ORAL | Status: DC
  Administered 2016-03-19 – 2016-03-20 (×2): 12.5 mg via ORAL
  Filled 2016-03-19 (×2): qty 1

## 2016-03-19 NOTE — Progress Notes (Signed)
Patient HR in the 140's and sustaining DR Winona LegatoVaickute was informed , stated [facility] see what the plan of treatment for the patient HR will be , and to continue to  monitor

## 2016-03-19 NOTE — Care Management (Signed)
Informed that Dr Juliann Paresallwood is cardiologist involved and do not see any follow up notes.  Paged physician to discuss plan of care regarding cardiac issues.

## 2016-03-19 NOTE — Progress Notes (Signed)
[facility] - [place] at [facility]                                                                                                                                                                                            Patient Demographics   Sherry Crosby, is a 82 y.o. female, DOB - 06/07/1929, ZOX:[id number]  Admit date - 03/10/2016   Admitting Physician Shaune Pollack, MD  Outpatient Primary MD for the patient is RINGEL, Dillard Essex, MD   LOS - 9  Subjective The patient feels good, wants to go home. Heart rate remains high at 130-140 today in the morning, off all medications due to intermittent bradycardia to the 30s. Cardiology input is pending.  Review of Systems:   CONSTITUTIONAL: No documented fever. No fatigue, Positive weakness. No weight gain, no weight loss.  EYES: No blurry or double vision.  ENT: No tinnitus. No postnasal drip. No redness of the oropharynx.  RESPIRATORY: No cough, no wheeze, no hemoptysis. No dyspnea.  CARDIOVASCULAR: No chest pain. No orthopnea. No palpitations. No syncope.  GASTROINTESTINAL: No nausea, no vomiting or diarrhea. No abdominal pain. No melena or hematochezia.  GENITOURINARY: No dysuria or hematuria.  ENDOCRINE: No polyuria or nocturia. No heat or cold intolerance.  HEMATOLOGY: No anemia. No bruising. No bleeding.  INTEGUMENTARY: No rashes. No lesions.  MUSCULOSKELETAL: No arthritis. No swelling. No gout. Left foot pain NEUROLOGIC: No numbness, tingling, or ataxia. No seizure-type activity.  PSYCHIATRIC: No anxiety. No insomnia. No ADD.    Vitals:   Vitals:   03/18/16 1957 03/19/16 0533 03/19/16 0745 03/19/16 1124  BP: (!) 110/55 107/73 (!) 117/49 93/62  Pulse: (!) 116 (!) 50 92 (!) 117  Resp: 18 18  16   Temp: 98.3 F (36.8 C) 97.9 F (36.6 C) 98.4 F (36.9 C) 97.8 F (36.6 C)  TempSrc: Oral Oral Oral   SpO2: 99% 96% 96% 96%  Weight:      Height:        Wt Readings from Last 3 Encounters:  03/10/16  43.1 kg (95 lb)  07/30/15 39.9 kg (88 lb)  05/12/15 39.9 kg (88 lb)     Intake/Output Summary (Last 24 hours) at 03/19/16 1233 Last data filed at 03/19/16 0900  Gross per 24 hour  Intake              240 ml  Output              300 ml  Net              -60 ml    Physical Exam:   GENERAL:  Pleasant-appearing in no apparent distress.  HEAD, EYES, EARS, NOSE AND THROAT: Atraumatic, normocephalic. Extraocular muscles are intact. Pupils equal and reactive to light. Sclerae anicteric. No conjunctival injection. No oro-pharyngeal erythema.  NECK: Supple. There is no jugular venous distention. No bruits, no lymphadenopathy, no thyromegaly.  HEART: S1, S2,irregularly irregular rate and rhythm, No murmurs, no rubs, no clicks.  LUNGS: Clear to auscultation bilaterally. No rales or rhonchi. No wheezes.  ABDOMEN: Soft, flat, nontender, nondistended. Has good bowel sounds. No hepatosplenomegaly appreciated.  EXTREMITIES: No evidence of any cyanosis, clubbing, or peripheral edema.  +2 pedal and radial pulses bilaterally left foot with some bruising. Power 2/5 in both legs. NEUROLOGIC: The patient is alert, awake, and oriented x3 with no focal motor or sensory deficits appreciated bilaterally.  SKIN: Moist and warm with no rashes appreciated.  Psych: Not anxious, depressed LN: No inguinal LN enlargement    Antibiotics   Anti-infectives    Start     Dose/Rate Route Frequency Ordered Stop   03/16/16 1600  cephALEXin (KEFLEX) capsule 500 mg  Status:  Discontinued     500 mg Oral Every 12 hours 03/16/16 1510 03/18/16 1533   03/11/16 1000  trimethoprim (TRIMPEX) tablet 100 mg  Status:  Discontinued     100 mg Oral Daily 03/10/16 2019 03/16/16 1510   03/10/16 2200  cefTRIAXone (ROCEPHIN) 1 g in dextrose 5 % 50 mL IVPB  Status:  Discontinued     1 g 100 mL/hr over 30 Minutes Intravenous Every 24 hours 03/10/16 2019 03/11/16 1256      Medications   Scheduled Meds: . aspirin EC  81 mg Oral Daily   . divalproex  375 mg Oral BID  . docusate sodium  100 mg Oral BID  . enoxaparin (LOVENOX) injection  30 mg Subcutaneous Q24H  . gabapentin  100 mg Oral Daily  . levETIRAcetam  500 mg Oral BID  . metoprolol succinate  12.5 mg Oral Daily  . metroNIDAZOLE  1 Applicatorful Vaginal QHS  . sodium chloride flush  3 mL Intravenous Q12H  . sodium chloride flush  3 mL Intravenous Q12H   Continuous Infusions:   PRN Meds:.sodium chloride, acetaminophen **OR** acetaminophen, albuterol, ondansetron **OR** ondansetron (ZOFRAN) IV, sodium chloride flush   Data Review:   Micro Results Recent Results (from the past 240 hour(s))  Urine culture     Status: Abnormal   Collection Time: 03/10/16  3:53 PM  Result Value Ref Range Status   Specimen Description URINE, CLEAN CATCH  Final   Special Requests NONE  Final   Culture MULTIPLE SPECIES PRESENT, SUGGEST RECOLLECTION (A)  Final   Report Status 03/12/2016 FINAL  Final  Chlamydia/NGC rt PCR ([facility] only)     Status: None   Collection Time: 03/15/16  6:20 PM  Result Value Ref Range Status   Specimen source GC/Chlam ENDOCERVICAL  Final   Chlamydia Tr TEST CANCELLED DUE TO IMPROPER COLLECTION SDR NOT DETECTED Final  Chlamydia/NGC rt PCR ([facility] only)     Status: None   Collection Time: 03/15/16  6:55 PM  Result Value Ref Range Status   Specimen source GC/Chlam ENDOCERVICAL  Final   Chlamydia Tr NOT DETECTED NOT DETECTED Final   N gonorrhoeae NOT DETECTED NOT DETECTED Final    Comment: (NOTE) 100  This methodology has not been evaluated in pregnant women or in 200  patients with a history of hysterectomy. 300 400  This methodology will not be performed on patients less than 14  years of  age.   Chlamydia/NGC rt PCR ([facility] only)     Status: None   Collection Time: 03/17/16  4:38 AM  Result Value Ref Range Status   Specimen source GC/Chlam URINE, RANDOM  Final   Chlamydia Tr NOT DETECTED NOT DETECTED Final   N gonorrhoeae NOT DETECTED NOT DETECTED  Final    Comment: (NOTE) 100  This methodology has not been evaluated in pregnant women or in 200  patients with a history of hysterectomy. 300 400  This methodology will not be performed on patients less than 42  years of age.     Radiology Reports Dg Chest 2 View  Result Date: 03/10/2016 CLINICAL DATA:  Atrial fibrillation, recurrent urinary tract infection EXAM: CHEST  2 VIEW COMPARISON:  None. FINDINGS: There is mild cardiomegaly present with probable small effusions and mild pulmonary vascular congestion suggesting mild CHF. No focal pneumonia is seen. No bony abnormality is seen other than diffuse osteopenia. IMPRESSION: Possible mild CHF with cardiomegaly and small effusions with questionable mild pulmonary vascular congestion. Electronically Signed   By: Dwyane Dee M.D.   On: 03/10/2016 16:41   Dg Foot 2 Views Left  Result Date: 03/11/2016 CLINICAL DATA:  Fall.  Bruising and left foot pain. EXAM: LEFT FOOT - 2 VIEW COMPARISON:  None available. FINDINGS: Moderate osteopenia is present. No acute bone or soft tissue abnormality is present. A severe hallux valgus deformity is noted. Prominent enthesophytes are present at the second metatarsal IMPRESSION: 1. Severe hallux valgus deformity. 2. Osteopenia. 3. No acute bone or soft tissue abnormality. Electronically Signed   By: Marin Roberts M.D.   On: 03/11/2016 11:38     CBC  Recent Labs Lab 03/13/16 0413 03/16/16 0522 03/17/16 0500  WBC 4.6 3.6 4.0  HGB 12.8 12.1 11.8*  HCT 36.6 34.8* 34.6*  PLT 94* 81* 78*  MCV 95.4 96.3 94.8  MCH 33.3 33.4 32.4  MCHC 34.9 34.7 34.2  RDW 13.6 13.2 12.7    Chemistries   Recent Labs Lab 03/16/16 0522 03/17/16 0500  NA  --  136  K  --  4.2  CL  --  104  CO2  --  30  GLUCOSE  --  91  BUN  --  18  CREATININE 0.82 0.82  CALCIUM  --  9.4   ------------------------------------------------------------------------------------------------------------------ estimated creatinine  clearance is 34.1 mL/min (by C-G formula based on SCr of 0.82 mg/dL). ------------------------------------------------------------------------------------------------------------------ No results for input(s): HGBA1C in the last 72 hours. ------------------------------------------------------------------------------------------------------------------ No results for input(s): CHOL, HDL, LDLCALC, TRIG, CHOLHDL, LDLDIRECT in the last 72 hours. ------------------------------------------------------------------------------------------------------------------ No results for input(s): TSH, T4TOTAL, T3FREE, THYROIDAB in the last 72 hours.  Invalid input(s): FREET3 ------------------------------------------------------------------------------------------------------------------ No results for input(s): VITAMINB12, FOLATE, FERRITIN, TIBC, IRON, RETICCTPCT in the last 72 hours.  Coagulation profile No results for input(s): INR, PROTIME in the last 168 hours.  No results for input(s): DDIMER in the last 72 hours.  Cardiac Enzymes No results for input(s): CKMB, TROPONINI, MYOGLOBIN in the last 168 hours.  Invalid input(s): CK ------------------------------------------------------------------------------------------------------------------ Invalid input(s): POCBNP    Assessment & Plan  Patient is a 82 year old admitted with generalized weakness noted to have A. fib with RVR  #1 Atrial fibrillation with tachy bradycardia syndrome. Appreciate cardiology following the patient. Rate is 140 today. Start Toprol-XL at 12.5 mg daily dose, advance it as needed, watching for bradycardia closely. Awaiting for cardiology's input, questionable permanent pacemaker.   #2 possible cystitis, urine cultures revealed multiple species, recollection was suggested, continue patient  on Keflex for next few days to complete course, of ceftriaxone.    #3 Acute diastolic CHF, LV EF: 60% -   65%. Due to Afib with RVR.  Patient was managed on lasix, her edema has decreased . Holding Lasix as she looks euvolemic at present.  #4 Hypoxia. Resolved,patient is on room air, O2 sats are 96%1. Get V/Q or CTA of chest if becomes hypoxemic again.   #5 history of seizures continue Keppra and Neurontin and Depakote  #6 left foot pain, improved. Pain control.  x-ray of her foot: Osteopenia.  #7 depression and anxiety continue Celexa  #8Weakness: PT. Recommends  SNF placement. Family prefers [place] home of [address] tremors due to deconditioning;  #9.vaginal discharge;lilkely bacterial vaginosis;flagyl vaginal. cream,improving  APPRECIATE  PRESENCE OF  RN DURING ROUNDS.       Code Status Orders        Start     Ordered   03/10/16 2020  Full code  Continuous     03/10/16 2019    Code Status History    Date Active Date Inactive Code Status Order ID Comments User Context   03/10/2016  8:19 PM 03/11/2016  7:53 AM Full Code [id number]  Shaune Pollack, MD Inpatient    Advance Directive Documentation   Flowsheet Row Most Recent Value  Type of Advance Directive  Living will  Pre-existing out of facility DNR order (yellow form or pink MOST form)  No data  "MOST" Form in Place?  No data           Consultscards  DVT Prophylaxis Lovenox monitor with low platelets  Lab Results  Component Value Date   PLT 78 (L) 03/17/2016     Time Spent in  40 minutes   greater than 50% of time spent in care coordination and counseling patient regarding the condition and plan of care.   Katharina Caper M.D on 03/19/2016 at 12:33 PM  Between 7am to 6pm - Pager - [phone]  After 6pm go to [email or web address] - password [email or web address] [facility]  [facility]   Office  [phone]

## 2016-03-20 DIAGNOSIS — M79672 Pain in left foot: Secondary | ICD-10-CM

## 2016-03-20 DIAGNOSIS — I5031 Acute diastolic (congestive) heart failure: Secondary | ICD-10-CM

## 2016-03-20 DIAGNOSIS — R0902 Hypoxemia: Secondary | ICD-10-CM

## 2016-03-20 DIAGNOSIS — I495 Sick sinus syndrome: Secondary | ICD-10-CM

## 2016-03-20 DIAGNOSIS — N3090 Cystitis, unspecified without hematuria: Secondary | ICD-10-CM

## 2016-03-20 LAB — CREATININE, SERUM: Creatinine, Ser: 0.79 mg/dL (ref 0.44–1.00)

## 2016-03-20 LAB — PLATELET COUNT: PLATELETS: 105 10*3/uL — AB (ref 150–440)

## 2016-03-20 MED ORDER — METOPROLOL SUCCINATE ER 25 MG PO TB24: 12.5000 mg | ORAL_TABLET | Freq: Every day | ORAL | 6 refills | Status: AC

## 2016-03-20 NOTE — Progress Notes (Signed)
Patient is being discharge in a stable condition , report given to floor nurse and pt's son was informed  That she is being pick up by EMS ,  pt's son has her watch with him .

## 2016-03-20 NOTE — Consult Note (Signed)
[facility] Cardiology Consultation Note  Patient ID: Sherry Crosby, MRN: [id number], DOB/AGE: 1929-03-14 82 y.o. Admit date: 03/10/2016   Date of Consult: 03/20/2016 Primary Physician: Lamount Cranker, MD Primary Cardiologist: Gwen Pounds  Chief Complaint:  Chief Complaint  Patient presents with  . Tachycardia   Reason for Consult: sick sinus syndrome  HPI: 82 y.o. female with known chronic nonvalvular atrial fibrillation with sick sinus syndrome having recent syncopal episode and shortness of breath and other concerns. The patient was admitted to the hospital for sick sinus syndrome and significant bradycardia for which multiple medications were discontinued. At that time the patient then had rapid ventricular rate for which she was difficult to control. After re-addition of medications the patient continued to have episodes into the 30 beat per minute range. The patient did have a Holter monitor earlier this year showing evidence of the sick sinus syndrome and long RR intervals consistent with the need for possible single-chamber pacemaker placement. This was discussed patient wishes not to pursue at the time. At this time the patient also had an echocardiogram showing normal LV systolic function and moderate valvular heart disease and atrial enlargement.. The patient has had no anticoagulation at this time due to concerns of fall risk and other wishes not to use this medication.  Past Medical History:  Diagnosis Date  . A-fib (HCC)   . Anxiety   . Depression   . High cholesterol   . Hx of degenerative disc disease   . Recurrent UTI   . Seizures [facility])       Surgical History:  Past Surgical History:  Procedure Laterality Date  . cataract surgery    . Tonsillectomy       Home Meds: Prior to Admission medications   Medication Sig Start Date End Date Taking? Authorizing Provider  aspirin EC 81 MG tablet Take 1 tablet by mouth daily. 07/04/15 07/03/16 Yes Historical Provider,  MD  calcium-vitamin D (OSCAL WITH D) 500-200 MG-UNIT tablet Take 1 tablet by mouth daily.   Yes Historical Provider, MD  citalopram (CELEXA) 20 MG tablet Take 1 tablet by mouth daily. 05/03/15  Yes Historical Provider, MD  Cyanocobalamin (VITAMIN B-12 CR) 1000 MCG TBCR Take 1 tablet by mouth daily.   Yes Historical Provider, MD  divalproex (DEPAKOTE) 250 MG DR tablet Take 375 mg by mouth 2 (two) times daily.  05/03/15  Yes Historical Provider, MD  estradiol (ESTRACE VAGINAL) 0.1 MG/GM vaginal cream Place 0.5 application vaginally 2 (two) times a week. 09/30/09  Yes Historical Provider, MD  gabapentin (NEURONTIN) 100 MG capsule Take 1 capsule by mouth daily. 05/06/15  Yes Historical Provider, MD  levETIRAcetam (KEPPRA) 500 MG tablet Take 1 tablet by mouth 2 (two) times daily. 05/06/15  Yes Historical Provider, MD  trimethoprim (TRIMPEX) 100 MG tablet Take 1 tablet by mouth daily. 06/18/15  Yes Historical Provider, MD  zonisamide (ZONEGRAN) 100 MG capsule Take 1 capsule by mouth daily. 05/03/15   Historical Provider, MD    Inpatient Medications:  . aspirin EC  81 mg Oral Daily  . divalproex  375 mg Oral BID  . docusate sodium  100 mg Oral BID  . gabapentin  100 mg Oral Daily  . levETIRAcetam  500 mg Oral BID  . metoprolol succinate  12.5 mg Oral Daily  . metroNIDAZOLE  1 Applicatorful Vaginal QHS  . sodium chloride flush  3 mL Intravenous Q12H  . sodium chloride flush  3 mL Intravenous Q12H  Allergies: No Known Allergies  Social History   Social History  . Marital status: Married    Spouse name: N/A  . Number of children: N/A  . Years of education: N/A   Occupational History  . Not on file.   Social History Main Topics  . Smoking status: Never Smoker  . Smokeless tobacco: Never Used  . Alcohol use No  . Drug use: No  . Sexual activity: Not on file   Other Topics Concern  . Not on file   Social History Narrative  . No narrative on file     Family History  Problem  Relation Age of Onset  . Diabetes Mother   . Hypertension Mother   . Stroke Mother   . Heart disease Father      Review of Systems Positive for Irregular heartbeat Negative for: General:  chills, fever, night sweats or weight changes.  Cardiovascular: PND orthopnea syncope dizziness  Dermatological skin lesions rashes Respiratory: Cough congestion Urologic: Frequent urination urination at night and hematuria Abdominal: negative for nausea, vomiting, diarrhea, bright red blood per rectum, melena, or hematemesis Neurologic: negative for visual changes, and/or hearing changes  All other systems reviewed and are otherwise negative except as noted above.  Labs: No results for input(s): CKTOTAL, CKMB, TROPONINI in the last 72 hours. Lab Results  Component Value Date   WBC 4.0 03/17/2016   HGB 11.8 (L) 03/17/2016   HCT 34.6 (L) 03/17/2016   MCV 94.8 03/17/2016   PLT 105 (L) 03/20/2016    Recent Labs Lab 03/17/16 0500 03/20/16 0450  NA 136  --   K 4.2  --   CL 104  --   CO2 30  --   BUN 18  --   CREATININE 0.82 0.79  CALCIUM 9.4  --   GLUCOSE 91  --    No results found for: CHOL, HDL, LDLCALC, TRIG No results found for: DDIMER  Radiology/Studies:  Dg Chest 2 View  Result Date: 03/10/2016 CLINICAL DATA:  Atrial fibrillation, recurrent urinary tract infection EXAM: CHEST  2 VIEW COMPARISON:  None. FINDINGS: There is mild cardiomegaly present with probable small effusions and mild pulmonary vascular congestion suggesting mild CHF. No focal pneumonia is seen. No bony abnormality is seen other than diffuse osteopenia. IMPRESSION: Possible mild CHF with cardiomegaly and small effusions with questionable mild pulmonary vascular congestion. Electronically Signed   By: Dwyane Dee M.D.   On: 03/10/2016 16:41   Dg Foot 2 Views Left  Result Date: 03/11/2016 CLINICAL DATA:  Fall.  Bruising and left foot pain. EXAM: LEFT FOOT - 2 VIEW COMPARISON:  None available. FINDINGS: Moderate  osteopenia is present. No acute bone or soft tissue abnormality is present. A severe hallux valgus deformity is noted. Prominent enthesophytes are present at the second metatarsal IMPRESSION: 1. Severe hallux valgus deformity. 2. Osteopenia. 3. No acute bone or soft tissue abnormality. Electronically Signed   By: Marin Roberts M.D.   On: 03/11/2016 11:38    EKG: Atrial fibrillation with rapid ventricular rate  Weights: Filed Weights   03/10/16 1541  Weight: 95 lb (43.1 kg)     Physical Exam: Blood pressure (!) 104/36, pulse (!) 57, temperature 97.8 F (36.6 C), temperature source Oral, resp. rate 16, height 5\' 1"  (1.549 m), weight 95 lb (43.1 kg), SpO2 97 %. Body mass index is 17.95 kg/m. General: Well developed, well nourished, in no acute distress. Head eyes ears nose throat: Normocephalic, atraumatic, sclera non-icteric, no xanthomas, nares are  without discharge. No apparent thyromegaly and/or mass  Lungs: Normal respiratory effort.  no wheezes, no rales, no rhonchi.  Heart: Irregular with normal S1 S2. no murmur gallop, no rub, PMI is normal size and placement, carotid upstroke normal without bruit, jugular venous pressure is normal Abdomen: Soft, non-tender, non-distended with normoactive bowel sounds. No hepatomegaly. No rebound/guarding. No obvious abdominal masses. Abdominal aorta is normal size without bruit Extremities: No edema. no cyanosis, no clubbing, no ulcers  Peripheral : 2+ bilateral upper extremity pulses, 2+ bilateral femoral pulses, 2+ bilateral dorsal pedal pulse Neuro: Alert and oriented. No facial asymmetry. No focal deficit. Moves all extremities spontaneously. Musculoskeletal: Normal muscle tone with  kyphosis Psych:  Responds to questions appropriately with a normal affect.    Assessment: 82 year old female with known chronic nonvalvular atrial fibrillation with known sick sinus syndrome and multiple symptoms including syncope and symptomatic bradycardia  now with rapid ventricular rate requiring re-addition of medication management which has been difficult at this time. We have discussed the possibility of single-chamber pacemaker placement and its risks and benefits.  Plan: 1. Reintroduction of medication management and follow closely for heart rate control and concerns of true sick sinus syndrome requiring single-chamber pacemaker placement 2. No anticoagulation at this time due to patient wishes as well as fall risk 3. Further diagnostic testing and treatment options after above  Signed, Lamar BlinksKOWALSKI,Shelvy Heckert J M.D. [facility] 03/20/2016, 8:48 AM

## 2016-03-20 NOTE — Care Management Important Message (Signed)
Important Message  Patient Details  Name: Thana AtesOzetta Q Baskette MRN: [id number] Date of Birth: 1929/03/25   Medicare Important Message Given:  Yes    Eber HongGreene, Govind Furey R, RN 03/20/2016, 10:31 AM

## 2016-03-20 NOTE — Progress Notes (Signed)
[facility]  Patient: Sherry Crosby / Admit Date: 03/10/2016 / Date of Encounter: 03/20/2016, 8:57 AM   Subjective patient converted to nsr with minimal medication of metorolol and without evidence of slow hr at this time  Review of Systems: Positive ZOX:WRUEfor:none Negative for: Vision change, hearing change, syncope, dizziness, nausea, vomiting,diarrhea, bloody stool, stomach pain, cough, congestion, diaphoresis, urinary frequency, urinary pain,skin lesions, skin rashes Others previously listed  Objective: Telemetry: nsr Physical Exam: Blood pressure (!) 104/36, pulse (!) 57, temperature 97.8 F (36.6 C), temperature source Oral, resp. rate 16, height 5\' 1"  (1.549 m), weight 95 lb (43.1 kg), SpO2 97 %. Body mass index is 17.95 kg/m. General: Well developed, well nourished, in no acute distress. Head: Normocephalic, atraumatic, sclera non-icteric, no xanthomas, nares are without discharge. Neck: No apparent masses Lungs: Normal respirations with no wheezes, no rhonchi, no rales , no crackles   Heart: Regular rate and rhythm, normal S1 S2, no murmur, no rub, no gallop, PMI is normal size and placement, carotid upstroke normal without bruit, jugular venous pressure normal Abdomen: Soft, non-tender, non-distended with normoactive bowel sounds. No hepatosplenomegaly. Abdominal aorta is normal size without bruit Extremities: No edema, no clubbing, no cyanosis, no ulcers,  Peripheral: 2+ radial, 2+ femoral, 2+ dorsal pedal pulses Neuro: Alert and oriented. Moves all extremities spontaneously. Psych:  Responds to questions appropriately with a normal affect.   Intake/Output Summary (Last 24 hours) at 03/20/16 0857 Last data filed at 03/20/16 0556  Gross per 24 hour  Intake              240 ml  Output              350 ml  Net             -110 ml    Inpatient Medications:  . aspirin EC  81 mg Oral Daily  . divalproex  375 mg Oral BID  . docusate sodium   100 mg Oral BID  . gabapentin  100 mg Oral Daily  . levETIRAcetam  500 mg Oral BID  . metoprolol succinate  12.5 mg Oral Daily  . metroNIDAZOLE  1 Applicatorful Vaginal QHS  . sodium chloride flush  3 mL Intravenous Q12H  . sodium chloride flush  3 mL Intravenous Q12H   Infusions:    Labs:  Recent Labs  03/20/16 0450  CREATININE 0.79   No results for input(s): AST, ALT, ALKPHOS, BILITOT, PROT, ALBUMIN in the last 72 hours.  Recent Labs  03/20/16 0450  PLT 105*   No results for input(s): CKTOTAL, CKMB, TROPONINI in the last 72 hours. Invalid input(s): POCBNP No results for input(s): HGBA1C in the last 72 hours.   Weights: Filed Weights   03/10/16 1541  Weight: 95 lb (43.1 kg)     Radiology/Studies:  Dg Chest 2 View  Result Date: 03/10/2016 CLINICAL DATA:  Atrial fibrillation, recurrent urinary tract infection EXAM: CHEST  2 VIEW COMPARISON:  None. FINDINGS: There is mild cardiomegaly present with probable small effusions and mild pulmonary vascular congestion suggesting mild CHF. No focal pneumonia is seen. No bony abnormality is seen other than diffuse osteopenia. IMPRESSION: Possible mild CHF with cardiomegaly and small effusions with questionable mild pulmonary vascular congestion. Electronically Signed   By: Dwyane DeePaul  Barry M.D.   On: 03/10/2016 16:41   Dg Foot 2 Views Left  Result Date: 03/11/2016 CLINICAL DATA:  Fall.  Bruising and left foot pain. EXAM: LEFT FOOT - 2 VIEW  COMPARISON:  None available. FINDINGS: Moderate osteopenia is present. No acute bone or soft tissue abnormality is present. A severe hallux valgus deformity is noted. Prominent enthesophytes are present at the second metatarsal IMPRESSION: 1. Severe hallux valgus deformity. 2. Osteopenia. 3. No acute bone or soft tissue abnormality. Electronically Signed   By: Marin Roberts M.D.   On: 03/11/2016 11:38     Assessment and Recommendation  82 y.o. female with paroxismal afib  And sick sinus  syndrome now in nsr with reasonable heart rate control and no more symptoms 1. Continue current med doses and therapy without change  2. Ambulate and follow for need in change in meds 3. Ok for dc to home with fu next week for further adjustments and further address of sick sinus and need for single chamber pacer for wihich patient wishes to defer if able  Signed, Arnoldo Hooker M.D. FACC

## 2016-03-20 NOTE — Clinical Social Work Note (Addendum)
[facility]  Patient and family agreeable to plans will transport via ems RN to call report to [phone] room E3.  MSW notified patient's son Baxter Flatteryric Cowdery at [phone] that patient is discharging to SNF today.   Windell MouldingEric Araceli Coufal, MSW Mon-Fri 8a-4:30p [phone]

## 2016-03-20 NOTE — Clinical Social Work Placement (Signed)
   CLINICAL SOCIAL WORK PLACEMENT  NOTE  Date:  03/20/2016  Patient Details  Name: Thana AtesOzetta Q Batalla MRN: [id number] Date of Birth: 03-26-1929  Clinical Social Work is seeking post-discharge placement for this patient at the Skilled  Nursing Facility level of care (*CSW will initial, date and re-position this form in  chart as items are completed):  Yes   Patient/family provided with [facility] Clinical Social Work Department's list of facilities offering this level of care within the geographic area requested by the patient (or if unable, by the patient's family).  Yes   Patient/family informed of their freedom to choose among providers that offer the needed level of care, that participate in Medicare, Medicaid or managed care program needed by the patient, have an available bed and are willing to accept the patient.  Yes   Patient/family informed of [place]'s ownership interest in [facility] and [facility], as well as of the fact that they are under no obligation to receive care at these facilities.  PASRR submitted to EDS on 03/13/16     PASRR number received on 03/13/16     Existing PASRR number confirmed on       FL2 transmitted to all facilities in geographic area requested by pt/family on 03/13/16     FL2 transmitted to all facilities within larger geographic area on       Patient informed that his/her managed care company has contracts with or will negotiate with certain facilities, including the following:        Yes   Patient/family informed of bed offers received.  [facility]     Physician recommends and patient chooses bed at      [facility].  [facility]     Patient family notified on 03/20/16 of transfer.  Name of family member notified:  Baxter Flatteryric [place] patient's son [phone]     PHYSICIAN        Additional Comment:    _______________________________________________ Darleene CleaverAnterhaus, Croix Presley R 03/20/2016, 12:07 PM

## 2016-03-20 NOTE — Discharge Summary (Signed)
[facility] [place] [facility]   PATIENT NAME: Sherry HoardOzetta Crosby    MR#:  [id number]  DATE OF BIRTH:  Jul 30, 1928  DATE OF ADMISSION:  03/10/2016 ADMITTING PHYSICIAN: Shaune PollackQing Chen, MD  DATE OF DISCHARGE: No discharge date for patient encounter.  PRIMARY CARE PHYSICIAN: Lamount CrankerINGEL, SARAH CORNWELL, MD     ADMISSION DIAGNOSIS:  Atrial fibrillation with RVR (HCC) [I48.91]  DISCHARGE DIAGNOSIS:  Principal Problem:   Sick sinus syndrome (HCC) Active Problems:   A-fib (HCC)   Cystitis   Hypoxia   Acute diastolic CHF (congestive heart failure) (HCC)   Left foot pain   SECONDARY DIAGNOSIS:   Past Medical History:  Diagnosis Date  . A-fib (HCC)   . Anxiety   . Depression   . High cholesterol   . Hx of degenerative disc disease   . Recurrent UTI   . Seizures (HCC)     .pro HOSPITAL COURSE:   The patient is a [age] year old Caucasian female with past medical history significant for history of paroxysmal atrial fibrillation, hyperlipidemia, recurrent UTIs, seizure disorder who presents to the hospital with complaints of 2 weeks history of generalized weakness. She was seen by her primary care physician, was noted to have A. fib, RVR and was sent to the hospital. She was treated with Cardizem intravenously in the emergency room and started on Cardizem drip. She required numerous medications including amiodarone, digoxin, beta blockers to control her heart rate, however, with his all medication. She was noted to be intermittently bradycardic with heart rate dipping down to 30s. At that point. All medications were stopped. She was initiated on very low dose of Toprol-XL due to A. fib, RVR, rate of 130, converted into sinus rhythm. She was seen by cardiologist and recommended to continue very low dose of beta blocker, Toprol-XL. She was deemed to be appropriate and stable to be discharged to skilled nursing facility today. Discussion by problem: #1. Sick sinus syndrome  with paroxysmal atrial fibrillation, tachybradycardia syndrome, now in sinus rhythm, continue aspirin and low-dose of Toprol-XL, follow up with cardiologist for possible permanent pacemaker placement consideration. No anticoagulation due to concerns of falls #2. Acute diastolic CHF, patient's  hypoxia improved with a improvement of heart rate, no diuretics were recommended, patient is to continue Toprol-XL at 12.5 mg daily dose #3 . Cystitis, urinary cultures came back numerous bacterial organisms, patient received antibiotic therapy while in the hospital and completed course, she remained asymptomatic #4. History of seizure disorder, patient is to continue her outpatient medications, no changes #5. Left foot pain, patient had an x-ray done, revealing severe hallux valgus deformity, osteopenia, but no other acute abnormalities, supportive therapy was recommended, physical therapy, pain medications as needed DISCHARGE CONDITIONS:   Stable  CONSULTS OBTAINED:    DRUG ALLERGIES:  No Known Allergies  DISCHARGE MEDICATIONS:   Current Discharge Medication List    START taking these medications   Details  metoprolol succinate (TOPROL-XL) 25 MG 24 hr tablet Take 0.5 tablets (12.5 mg total) by mouth daily. Qty: 30 tablet, Refills: 6      CONTINUE these medications which have NOT CHANGED   Details  aspirin EC 81 MG tablet Take 1 tablet by mouth daily.    calcium-vitamin D (OSCAL WITH D) 500-200 MG-UNIT tablet Take 1 tablet by mouth daily.    citalopram (CELEXA) 20 MG tablet Take 1 tablet by mouth daily.    Cyanocobalamin (VITAMIN B-12 CR) 1000 MCG TBCR Take 1 tablet by mouth daily.  divalproex (DEPAKOTE) 250 MG DR tablet Take 375 mg by mouth 2 (two) times daily.     estradiol (ESTRACE VAGINAL) 0.1 MG/GM vaginal cream Place 0.5 application vaginally 2 (two) times a week.    gabapentin (NEURONTIN) 100 MG capsule Take 1 capsule by mouth daily.    levETIRAcetam (KEPPRA) 500 MG tablet  Take 1 tablet by mouth 2 (two) times daily.    trimethoprim (TRIMPEX) 100 MG tablet Take 1 tablet by mouth daily.    zonisamide (ZONEGRAN) 100 MG capsule Take 1 capsule by mouth daily.         DISCHARGE INSTRUCTIONS:    Patient is to follow-up with primary care physician and cardiologist as outpatient  If you experience worsening of your admission symptoms, develop shortness of breath, life threatening emergency, suicidal or homicidal thoughts you must seek medical attention immediately by calling 911 or calling your MD immediately  if symptoms less severe.  You Must read complete instructions/literature along with all the possible adverse reactions/side effects for all the Medicines you take and that have been prescribed to you. Take any new Medicines after you have completely understood and accept all the possible adverse reactions/side effects.   Please note  You were cared for by a hospitalist during your hospital stay. If you have any questions about your discharge medications or the care you received while you were in the hospital after you are discharged, you can call the unit and asked to speak with the hospitalist on call if the hospitalist that took care of you is not available. Once you are discharged, your primary care physician will handle any further medical issues. Please note that NO REFILLS for any discharge medications will be authorized once you are discharged, as it is imperative that you return to your primary care physician (or establish a relationship with a primary care physician if you do not have one) for your aftercare needs so that they can reassess your need for medications and monitor your lab values.    Today   CHIEF COMPLAINT:   Chief Complaint  Patient presents with  . Tachycardia    HISTORY OF PRESENT ILLNESS:  Sherry Crosby  is a 82 y.o. female with a known history of paroxysmal atrial fibrillation, hyperlipidemia, recurrent UTIs, seizure disorder  who presents to the hospital with complaints of 2 weeks history of generalized weakness. She was seen by her primary care physician, was noted to have A. fib, RVR and was sent to the hospital. She was treated with Cardizem intravenously in the emergency room and started on Cardizem drip. She required numerous medications including amiodarone, digoxin, beta blockers to control her heart rate, however, with his all medication. She was noted to be intermittently bradycardic with heart rate dipping down to 30s. At that point. All medications were stopped. She was initiated on very low dose of Toprol-XL due to A. fib, RVR, rate of 130, converted into sinus rhythm. She was seen by cardiologist and recommended to continue very low dose of beta blocker, Toprol-XL. She was deemed to be appropriate and stable to be discharged to skilled nursing facility today. Discussion by problem: #1. Sick sinus syndrome with paroxysmal atrial fibrillation, tachybradycardia syndrome, now in sinus rhythm, continue aspirin and low-dose of Toprol-XL, follow up with cardiologist for possible permanent pacemaker placement consideration. No anticoagulation due to concerns of falls #2. Acute diastolic CHF, patient's  hypoxia improved with a improvement of heart rate, no diuretics were recommended, patient is to continue Toprol-XL at 12.5  mg daily dose #3 . Cystitis, urinary cultures came back numerous bacterial organisms, patient received antibiotic therapy while in the hospital and completed course, she remained asymptomatic #4. History of seizure disorder, patient is to continue her outpatient medications, no changes #5. Left foot pain, patient had an x-ray done, revealing severe hallux valgus deformity, osteopenia, but no other acute abnormalities, supportive therapy was recommended, physical therapy, pain medications as needed    VITAL SIGNS:  Blood pressure (!) 104/36, pulse 98, temperature 97.8 F (36.6 C), temperature source  Oral, resp. rate 16, height 5\' 1"  (1.549 m), weight 43.1 kg (95 lb), SpO2 97 %.  I/O:   Intake/Output Summary (Last 24 hours) at 03/20/16 1029 Last data filed at 03/20/16 0953  Gross per 24 hour  Intake                0 ml  Output              350 ml  Net             -350 ml    PHYSICAL EXAMINATION:  GENERAL:  82 y.o.-year-old patient lying in the bed with no acute distress.  EYES: Pupils equal, round, reactive to light and accommodation. No scleral icterus. Extraocular muscles intact.  HEENT: Head atraumatic, normocephalic. Oropharynx and nasopharynx clear.  NECK:  Supple, no jugular venous distention. No thyroid enlargement, no tenderness.  LUNGS: Normal breath sounds bilaterally, no wheezing, rales,rhonchi or crepitation. No use of accessory muscles of respiration.  CARDIOVASCULAR: S1, S2 normal. No murmurs, rubs, or gallops.  ABDOMEN: Soft, non-tender, non-distended. Bowel sounds present. No organomegaly or mass.  EXTREMITIES: No pedal edema, cyanosis, or clubbing.  NEUROLOGIC: Cranial nerves II through XII are intact. Muscle strength 5/5 in all extremities. Sensation intact. Gait not checked.  PSYCHIATRIC: The patient is alert and oriented x 3.  SKIN: No obvious rash, lesion, or ulcer.   DATA REVIEW:   CBC  Recent Labs Lab 03/17/16 0500 03/20/16 0450  WBC 4.0  --   HGB 11.8*  --   HCT 34.6*  --   PLT 78* 105*    Chemistries   Recent Labs Lab 03/17/16 0500 03/20/16 0450  NA 136  --   K 4.2  --   CL 104  --   CO2 30  --   GLUCOSE 91  --   BUN 18  --   CREATININE 0.82 0.79  CALCIUM 9.4  --     Cardiac Enzymes No results for input(s): TROPONINI in the last 168 hours.  Microbiology Results  Results for orders placed or performed during the hospital encounter of 03/10/16  Urine culture     Status: Abnormal   Collection Time: 03/10/16  3:53 PM  Result Value Ref Range Status   Specimen Description URINE, CLEAN CATCH  Final   Special Requests NONE  Final    Culture MULTIPLE SPECIES PRESENT, SUGGEST RECOLLECTION (A)  Final   Report Status 03/12/2016 FINAL  Final  Chlamydia/NGC rt PCR ([facility] only)     Status: None   Collection Time: 03/15/16  6:20 PM  Result Value Ref Range Status   Specimen source GC/Chlam ENDOCERVICAL  Final   Chlamydia Tr TEST CANCELLED DUE TO IMPROPER COLLECTION SDR NOT DETECTED Final  Chlamydia/NGC rt PCR ([facility] only)     Status: None   Collection Time: 03/15/16  6:55 PM  Result Value Ref Range Status   Specimen source GC/Chlam ENDOCERVICAL  Final   Chlamydia Tr NOT DETECTED  NOT DETECTED Final   N gonorrhoeae NOT DETECTED NOT DETECTED Final    Comment: (NOTE) 100  This methodology has not been evaluated in pregnant women or in 200  patients with a history of hysterectomy. 300 400  This methodology will not be performed on patients less than 12  years of age.   Chlamydia/NGC rt PCR ([facility] only)     Status: None   Collection Time: 03/17/16  4:38 AM  Result Value Ref Range Status   Specimen source GC/Chlam URINE, RANDOM  Final   Chlamydia Tr NOT DETECTED NOT DETECTED Final   N gonorrhoeae NOT DETECTED NOT DETECTED Final    Comment: (NOTE) 100  This methodology has not been evaluated in pregnant women or in 200  patients with a history of hysterectomy. 300 400  This methodology will not be performed on patients less than 32  years of age.     RADIOLOGY:  No results found.  EKG:   Orders placed or performed during the hospital encounter of 03/10/16  . EKG 12-Lead  . EKG 12-Lead  . ED EKG within 10 minutes  . ED EKG within 10 minutes      Management plans discussed with the patient, family and they are in agreement.  CODE STATUS:     Code Status Orders        Start     Ordered   03/10/16 2020  Full code  Continuous     03/10/16 2019    Code Status History    Date Active Date Inactive Code Status Order ID Comments User Context   03/10/2016  8:19 PM 03/11/2016  7:53 AM Full Code [id number]  Shaune Pollack, MD Inpatient    Advance Directive Documentation   Flowsheet Row Most Recent Value  Type of Advance Directive  Living will  Pre-existing out of facility DNR order (yellow form or pink MOST form)  No data  "MOST" Form in Place?  No data      TOTAL TIME TAKING CARE OF THIS PATIENT: 40 minutes.    Katharina Caper M.D on 03/20/2016 at 10:29 AM  Between 7am to 6pm - Pager - [phone]  After 6pm go to [email or web address] - password [email or web address] [facility]  [facility] [place] [facility]  Office  [phone]  CC: Primary care physician; Lamount Cranker, MD

## 2016-03-20 NOTE — Care Management (Signed)
Cardiology has assessed.Now that patient is in normal sinus rhythm heart rate is stable.  For discharge to snf today.

## 2016-03-20 NOTE — Care Management (Signed)
Have paged Dr Juliann Paresallwood to discuss cardiology follow up

## 2017-12-27 ENCOUNTER — Encounter: Payer: Self-pay | Admitting: *Deleted

## 2017-12-27 ENCOUNTER — Emergency Department: Payer: Medicare Other

## 2017-12-27 ENCOUNTER — Other Ambulatory Visit: Payer: Self-pay

## 2017-12-27 ENCOUNTER — Emergency Department
Admission: EM | Admit: 2017-12-27 | Discharge: 2017-12-27 | Disposition: A | Discharge status: Home / Self Care | Payer: Medicare Other | Attending: Student in an Organized Health Care Education/Training Program | Admitting: Student in an Organized Health Care Education/Training Program

## 2017-12-27 DIAGNOSIS — Y9389 Activity, other specified: Secondary | ICD-10-CM | POA: Insufficient documentation

## 2017-12-27 DIAGNOSIS — F419 Anxiety disorder, unspecified: Secondary | ICD-10-CM | POA: Insufficient documentation

## 2017-12-27 DIAGNOSIS — S0101XA Laceration without foreign body of scalp, initial encounter: Secondary | ICD-10-CM | POA: Insufficient documentation

## 2017-12-27 DIAGNOSIS — Y92128 Other place in nursing home as the place of occurrence of the external cause: Secondary | ICD-10-CM | POA: Insufficient documentation

## 2017-12-27 DIAGNOSIS — Z23 Encounter for immunization: Secondary | ICD-10-CM | POA: Insufficient documentation

## 2017-12-27 DIAGNOSIS — Y998 Other external cause status: Secondary | ICD-10-CM | POA: Insufficient documentation

## 2017-12-27 DIAGNOSIS — F0390 Unspecified dementia without behavioral disturbance: Secondary | ICD-10-CM | POA: Insufficient documentation

## 2017-12-27 DIAGNOSIS — Z79899 Other long term (current) drug therapy: Secondary | ICD-10-CM | POA: Insufficient documentation

## 2017-12-27 DIAGNOSIS — W01190A Fall on same level from slipping, tripping and stumbling with subsequent striking against furniture, initial encounter: Secondary | ICD-10-CM | POA: Insufficient documentation

## 2017-12-27 DIAGNOSIS — I5031 Acute diastolic (congestive) heart failure: Secondary | ICD-10-CM | POA: Diagnosis not present

## 2017-12-27 DIAGNOSIS — S0990XA Unspecified injury of head, initial encounter: Secondary | ICD-10-CM

## 2017-12-27 DIAGNOSIS — F329 Major depressive disorder, single episode, unspecified: Secondary | ICD-10-CM | POA: Diagnosis not present

## 2017-12-27 LAB — BASIC METABOLIC PANEL
Anion gap: 8 (ref 5–15)
BUN: 14 mg/dL (ref 8–23)
CALCIUM: 9.5 mg/dL (ref 8.9–10.3)
CO2: 25 mmol/L (ref 22–32)
CREATININE: 0.82 mg/dL (ref 0.44–1.00)
Chloride: 108 mmol/L (ref 98–111)
GFR calc Af Amer: >60 mL/min (ref >60)
GFR calc non Af Amer: >60 mL/min (ref >60)
Glucose, Bld: 125 mg/dL — ABNORMAL HIGH (ref 70–99)
Potassium: 3 mmol/L — ABNORMAL LOW (ref 3.5–5.1)
Sodium: 141 mmol/L (ref 135–145)

## 2017-12-27 LAB — URINALYSIS, COMPLETE (UACMP) WITH MICROSCOPIC
BACTERIA UA: NONE SEEN
BILIRUBIN URINE: NEGATIVE
Glucose, UA: NEGATIVE mg/dL
KETONES UR: NEGATIVE mg/dL
LEUKOCYTES UA: NEGATIVE
NITRITE: NEGATIVE
PH: 7 (ref 5.0–8.0)
Protein, ur: NEGATIVE mg/dL
SQUAMOUS EPITHELIAL / LPF: NONE SEEN (ref 0–5)
Specific Gravity, Urine: 1.012 (ref 1.005–1.030)

## 2017-12-27 LAB — CBC
HCT: 39.1 % (ref 35.0–47.0)
HEMOGLOBIN: 13.2 g/dL (ref 12.0–16.0)
MCH: 32.6 pg (ref 26.0–34.0)
MCHC: 33.8 g/dL (ref 32.0–36.0)
MCV: 96.5 fL (ref 80.0–100.0)
Platelets: 139 10*3/uL — ABNORMAL LOW (ref 150–440)
RBC: 4.06 MIL/uL (ref 3.80–5.20)
RDW: 13.4 % (ref 11.5–14.5)
WBC: 3.4 10*3/uL — ABNORMAL LOW (ref 3.6–11.0)

## 2017-12-27 LAB — TROPONIN I: Troponin I: <0.03 ng/mL (ref <0.03)

## 2017-12-27 MED ORDER — LIDOCAINE-EPINEPHRINE-TETRACAINE (LET) SOLUTION
3.0000 mL | Freq: Once | NASAL | Status: AC
  Administered 2017-12-27: 3 mL via TOPICAL
  Filled 2017-12-27: qty 3

## 2017-12-27 MED ORDER — BUPIVACAINE HCL (PF) 0.5 % IJ SOLN
30.0000 mL | Freq: Once | INTRAMUSCULAR | Status: AC
  Administered 2017-12-27: 30 mL
  Filled 2017-12-27: qty 30

## 2017-12-27 MED ORDER — TETANUS-DIPHTH-ACELL PERTUSSIS 5-2.5-18.5 LF-MCG/0.5 IM SUSP
0.5000 mL | Freq: Once | INTRAMUSCULAR | Status: AC
  Administered 2017-12-27: 0.5 mL via INTRAMUSCULAR
  Filled 2017-12-27: qty 0.5

## 2017-12-27 MED ORDER — ACETAMINOPHEN 500 MG PO TABS
1000.0000 mg | ORAL_TABLET | Freq: Once | ORAL | Status: AC
  Administered 2017-12-27: 1000 mg via ORAL
  Filled 2017-12-27: qty 2

## 2017-12-27 MED ORDER — METOPROLOL TARTRATE 25 MG PO TABS
ORAL_TABLET | ORAL | Status: AC
  Filled 2017-12-27: qty 1

## 2017-12-27 MED ORDER — METOPROLOL SUCCINATE ER 25 MG PO TB24: 12.5000 mg | ORAL_TABLET | Freq: Every day | ORAL | Status: DC

## 2017-12-27 MED ORDER — METOPROLOL SUCCINATE ER 25 MG PO TB24
12.5000 mg | ORAL_TABLET | Freq: Every day | ORAL | Status: DC
  Administered 2017-12-27: 12.5 mg via ORAL
  Filled 2017-12-27: qty 0.5

## 2017-12-27 NOTE — ED Provider Notes (Signed)
[facility] Emergency Department Provider Note    First MD Initiated Contact with Patient 12/27/17 1512     (approximate)  I have reviewed the triage vital signs and the nursing notes.   HISTORY  Chief Complaint Fall and Head Injury  Level V Caveat:  dementia  HPI Sherry Crosby is a 84 y.o. female presents from [facility] in a wheelchair bound that had an unwitnessed fall in the hallway where she tried to get out of her wheelchair and was walking down the hallway and fell hitting her head up furniture.  There is no LOC.  Patient is noncommunicative and this is reportedly her baseline per EMS and facility.  No other report of any other complaints.  Has obvious deformity injury to the left forehead.  Not on any blood thinners.    Past Medical History:  Diagnosis Date  . A-fib (HCC)   . Anxiety   . Depression   . High cholesterol   . Hx of degenerative disc disease   . Recurrent UTI   . Seizures (HCC)    Family History  Problem Relation Age of Onset  . Diabetes Mother   . Hypertension Mother   . Stroke Mother   . Heart disease Father    Past Surgical History:  Procedure Laterality Date  . cataract surgery    . Tonsillectomy     Patient Active Problem List   Diagnosis Date Noted  . Sick sinus syndrome (HCC) 03/20/2016  . Cystitis 03/20/2016  . Hypoxia 03/20/2016  . Acute diastolic CHF (congestive heart failure) (HCC) 03/20/2016  . Left foot pain 03/20/2016  . A-fib (HCC) 03/10/2016      Prior to Admission medications   Medication Sig Start Date End Date Taking? Authorizing Provider  acetaminophen (TYLENOL) 325 MG tablet Take 650 mg by mouth every 4 (four) hours as needed for fever.   Yes [provider]  amiodarone (PACERONE) 200 MG tablet Take 100 mg by mouth daily.   Yes [provider]  citalopram (CELEXA) 10 MG tablet Take 10 mg by mouth daily.   Yes [provider]  gabapentin (NEURONTIN) 100  MG capsule Take 100 mg by mouth daily.    Yes [provider]  guaifenesin (ROBITUSSIN) 100 MG/5ML syrup Take 100 mg by mouth every 4 (four) hours as needed for cough.   Yes [provider]  metoprolol succinate (TOPROL-XL) 25 MG 24 hr tablet Take 0.5 tablets (12.5 mg total) by mouth daily. 03/21/16  Yes Katharina Caper, MD  omeprazole (PRILOSEC) 20 MG capsule Take 20 mg by mouth daily.   Yes [provider]  vitamin B-12 (CYANOCOBALAMIN) 1000 MCG tablet Take 1,000 mcg by mouth daily.   Yes [provider]  zonisamide (ZONEGRAN) 100 MG capsule Take 100 mg by mouth daily.    Yes [provider]    Allergies Patient has no known allergies.    Social History Social History   Tobacco Use  . Smoking status: Never Smoker  . Smokeless tobacco: Never Used  Substance Use Topics  . Alcohol use: No  . Drug use: No    Review of Systems Patient denies headaches, rhinorrhea, blurry vision, numbness, shortness of breath, chest pain, edema, cough, abdominal pain, nausea, vomiting, diarrhea, dysuria, fevers, rashes or hallucinations unless otherwise stated above in HPI. ____________________________________________   PHYSICAL EXAM:  VITAL SIGNS: Vitals:   12/27/17 1630 12/27/17 1645  BP: (!) 182/71 (!) 176/66  Pulse: (!) 56  Resp: 13 (!) 22  Temp:    SpO2: 96%     Constitutional: Alert non verbal Eyes: Conjunctivae are normal.  Head: 3cm laceration to left forehead, no galeal injury, hemostatic Nose: No congestion/rhinnorhea. Mouth/Throat: Mucous membranes are moist.   Neck: No stridor. Painless ROM.  Cardiovascular: irregularly irregular rhythm. Grossly normal heart sounds.  Good peripheral circulation. Respiratory: Normal respiratory effort.  No retractions. Lungs CTAB. Gastrointestinal: Soft and nontender. No distention. No abdominal bruits. No CVA tenderness. Genitourinary: deferred Musculoskeletal: No lower extremity tenderness nor  edema.  No joint effusions. Neurologic:  MAE spontaneously, No facial droop Skin:  Skin is warm, dry and intact. No rash noted. Laceration to forehead as described above Psychiatric: unable to assess ____________________________________________   LABS (all labs ordered are listed, but only abnormal results are displayed)  Results for orders placed or performed during the hospital encounter of 12/27/17 (from the past 24 hour(s))  CBC     Status: Abnormal   Collection Time: 12/27/17  3:39 PM  Result Value Ref Range   WBC 3.4 (L) 3.6 - 11.0 K/uL   RBC 4.06 3.80 - 5.20 MIL/uL   Hemoglobin 13.2 12.0 - 16.0 g/dL   HCT 16.139.1 09.635.0 - 04.547.0 %   MCV 96.5 80.0 - 100.0 fL   MCH 32.6 26.0 - 34.0 pg   MCHC 33.8 32.0 - 36.0 g/dL   RDW 40.913.4 81.111.5 - 91.414.5 %   Platelets 139 (L) 150 - 440 K/uL  Urinalysis, Complete w Microscopic     Status: Abnormal   Collection Time: 12/27/17  3:39 PM  Result Value Ref Range   Color, Urine YELLOW (A) YELLOW   APPearance CLEAR (A) CLEAR   Specific Gravity, Urine 1.012 1.005 - 1.030   pH 7.0 5.0 - 8.0   Glucose, UA NEGATIVE NEGATIVE mg/dL   Hgb urine dipstick SMALL (A) NEGATIVE   Bilirubin Urine NEGATIVE NEGATIVE   Ketones, ur NEGATIVE NEGATIVE mg/dL   Protein, ur NEGATIVE NEGATIVE mg/dL   Nitrite NEGATIVE NEGATIVE   Leukocytes, UA NEGATIVE NEGATIVE   RBC / HPF 6-10 0 - 5 RBC/hpf   WBC, UA 0-5 0 - 5 WBC/hpf   Bacteria, UA NONE SEEN NONE SEEN   Squamous Epithelial / LPF NONE SEEN 0 - 5   Mucus PRESENT    ____________________________________________  EKG My review and personal interpretation at Time: 15:09   Indication: fall  Rate: 60  Rhythm: 3:1 a flutter, Axis: normal Other: no stemi, nonsepcific st abn ____________________________________________  RADIOLOGY  I personally reviewed all radiographic images ordered to evaluate for the above acute complaints and reviewed radiology reports and findings.  These findings were personally discussed with the  patient.  Please see medical record for radiology report.  ____________________________________________   PROCEDURES  Procedure(s) performed:  Marland Kitchen.Marland Kitchen.Laceration Repair Date/Time: 12/27/2017 5:02 PM Performed by: Willy Eddyobinson, Manilla Strieter, MD Authorized by: Willy Eddyobinson, Danil Wedge, MD   Consent:    Consent obtained:  Emergent situation   Consent given by:  Patient   Risks discussed:  Infection, pain, retained foreign body, poor cosmetic result and poor wound healing Anesthesia (see MAR for exact dosages):    Anesthesia method:  Local infiltration   Local anesthetic:  Lidocaine 1% w/o epi and bupivacaine 0.25% w/o epi Laceration details:    Location:  Face   Face location:  Forehead   Length (cm):  3   Depth (mm):  4 Repair type:    Repair type:  Simple Exploration:    Hemostasis achieved with:  Direct pressure   Wound exploration: entire depth of wound probed and visualized     Contaminated: no   Treatment:    Area cleansed with:  Saline and Betadine   Amount of cleaning:  Extensive   Irrigation solution:  Sterile saline   Visualized foreign bodies/material removed: no   Skin repair:    Repair method:  Sutures   Suture size:  5-0   Suture material:  Nylon   Suture technique:  Simple interrupted Approximation:    Approximation:  Close Post-procedure details:    Dressing:  Sterile dressing   Patient tolerance of procedure:  Tolerated well, no immediate complications      Critical Care performed: no ____________________________________________   INITIAL IMPRESSION / ASSESSMENT AND PLAN / ED COURSE  Pertinent labs & imaging results that were available during my care of the patient were reviewed by me and considered in my medical decision making (see chart for details).   DDX: SDH, IPH, concussion, SAH, fracture, laceration, dysrhythmia, lecture light abnormality, dehydration  Sherry Crosby is a 84 y.o. who presents to the ED with presents with fall as described above with head  injury.  Laceration repaired as above.  CT head ordered to evaluate for acute intracranial contusion, fracture shows no acute abnormality.  Based on her unwitnessed fall will observe in the ER on cardiac telemetry monitor further risk stratify for ACS with repeat troponin.  Clinical Course as of Dec 28 2038  Mon Dec 27, 2017  1745 Blood work returns reassuring.  As this was an unwitnessed fall given her heart history will order repeat troponin to further risk stratify this seems primarily mechanical.   [PR]  1923 Repeat troponin normal.  Patient in no acute distress at this point.  She is eating and drinking.  At this point do believe she stable and appropriate for outpatient follow-up.   [PR]    Clinical Course User Index [PR] Willy Eddy, MD     As part of my medical decision making, I reviewed the following data within the electronic MEDICAL RECORD NUMBER Nursing notes reviewed and incorporated, Labs reviewed, notes from prior ED visits and [place] Controlled Substance Database   ____________________________________________   FINAL CLINICAL IMPRESSION(S) / ED DIAGNOSES  Final diagnoses:  Injury of head, initial encounter  Laceration of scalp, initial encounter      NEW MEDICATIONS STARTED DURING THIS VISIT:  New Prescriptions   No medications on file     Note:  This document was prepared using Dragon voice recognition software and may include unintentional dictation errors.    Willy Eddy, MD 12/27/17 2040

## 2017-12-27 NOTE — ED Triage Notes (Signed)
Pt is WC bound resident of [facility] facility. Pt sustained unwitnessed fall in hallway outside her room. Pt presents w/ head injury to L forehead and laceration below L eye. Pt is non-communicative at this time and reports per EMS that this is her baseline per the staff at facility. Pt in no obvious acute distress at this time. Pt is hypertensive and a-flutter on the monitor w/ PMH of a-fib.

## 2019-01-12 ENCOUNTER — Non-Acute Institutional Stay: Payer: Medicare Other | Admitting: Primary Care

## 2019-01-12 ENCOUNTER — Other Ambulatory Visit: Payer: Self-pay

## 2019-01-12 DIAGNOSIS — Z515 Encounter for palliative care: Secondary | ICD-10-CM

## 2019-01-12 NOTE — Progress Notes (Signed)
[facility] Palliative Care Consult Note Telephone: [phone]  Fax: [phone]  TELEHEALTH VISIT STATEMENT Due to the COVID-19 crisis, this visit was done via telemedicine from my office. It was initiated and consented to by this patient and/or family.  PATIENT NAME: Sherry Crosby DOB: 04/04/1929 MRN: [id number]  PRIMARY CARE PROVIDER:   Marisa Hua, MD  REFERRING PROVIDER: Marisa Hua, MD [place],  [place] [address] [phone]   RESPONSIBLE PARTY:   Extended Emergency Contact Information Primary Emergency Contact: Nils Pyle Address: [address]          [place], [place] [id number] Johnnette Litter of [place] Phone: [phone] Relation: Spouse Secondary Emergency Contact: Rondel [place] Phone: [phone] Relation: Son  Palliative Care was asked to follow this patient by consultation request of Marisa Hua, MD.  This is a follow up visit.  ASSESSMENT AND RECOMMENDATIONS:   1. Goals of Care: Maximize quality of life and symptom management.  2. Symptom Management:   Constipation: Recommend Miralax 17 gm daily and senna 1-2 tabs twice a day to achieve softer stools.Currently on Bisacodyl 5 mg daily, which I would hold with the above protocol.  Mobility: Staying in the bed but then fakes seizures per staff. Has thrown self on floor several times,has  bed on low setting and mats on floor for safety. Family concurs this is longstanding behavior.  Agitation; Family states tantrums when she does not her way, and may be forgetting she can't walk. They are keeping residents limited in ambulation to contain infection. She has been a resident of [facility] for 2 years. Does have increased agitation if she has a  UTI. Recommend pcp assess if this has not been done.  History of psychiatric issues, recommend referral to geriatric psychiatry. Family states history of hallucinations  for some years.  Seizures: History of seizures but also some behavior issues with hallucinations. Family states they have been here all her life. 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 They were told some of the behaviors are from scars but never was diagnosed by a psychiatrist. She had seen a neurologist on a few occasions.  3. Family /Caregiver/Community Supports: Family in area, son and daughter in law, and husband.   4. Cognitive / Functional decline: Alert, talking to staff. Increasingly combative per staff report.  5. Advanced Care Directive: Full Code, RR makes her health care, decisions. [name] is on [facility] [phone]. Will continue to discuss goals of care with family.  6. Follow up Palliative Care Visit: Palliative care will continue to follow for goals of care clarification and symptom management. Return 3 weeks or prn.  I spent 45 minutes providing this consultation,  from 1400 to 1445. More than 50% of the time in this consultation was spent coordinating communication.   HISTORY OF PRESENT ILLNESS:  Sherry Crosby is a 85 y.o. year old female with multiple medical problems including Anxiety, afib  H/o UTI, depression, HLD, seizures.Palliative Care was asked to help address goals of care.   CODE STATUS: FULL  PPS: 30% HOSPICE ELIGIBILITY/DIAGNOSIS: TBD  PAST MEDICAL HISTORY:  Past Medical History:  Diagnosis Date  . A-fib ([place])   . Anxiety   . Depression   . High cholesterol   . Hx of degenerative disc disease   . Recurrent UTI   . Seizures ([place])     SOCIAL HX:  Social History   Tobacco Use  .  Smoking status: Never Smoker  . Smokeless tobacco: Never Used  Substance Use Topics  . Alcohol use: No    ALLERGIES: No Known Allergies   PERTINENT MEDICATIONS:  Outpatient Encounter Medications as of 01/12/2019  Medication Sig  . acetaminophen (TYLENOL) 325 MG tablet Take 650 mg by mouth every 4 (four) hours as needed for fever.  Marland Kitchen. amiodarone  (PACERONE) 200 MG tablet Take 100 mg by mouth daily.  . citalopram (CELEXA) 10 MG tablet Take 10 mg by mouth daily.  Marland Kitchen. gabapentin (NEURONTIN) 100 MG capsule Take 100 mg by mouth daily.   Marland Kitchen. guaifenesin (ROBITUSSIN) 100 MG/5ML syrup Take 100 mg by mouth every 4 (four) hours as needed for cough.  . metoprolol succinate (TOPROL-XL) 25 MG 24 hr tablet Take 0.5 tablets (12.5 mg total) by mouth daily.  Marland Kitchen. omeprazole (PRILOSEC) 20 MG capsule Take 20 mg by mouth daily.  . vitamin B-12 (CYANOCOBALAMIN) 1000 MCG tablet Take 1,000 mcg by mouth daily.  Marland Kitchen. zonisamide (ZONEGRAN) 100 MG capsule Take 100 mg by mouth daily.    No facility-administered encounter medications on file as of 01/12/2019.     PHYSICAL EXAM/ROS:   Current and past weights: 83 lbs General: NAD, frail appearing, thin, some pain on occ. Cardiovascular: no chest pain reported, no edema reported Pulmonary: no cough, no increased SOB Abdomen: appetite 50% , endorses constipation, incontinent of bowel GU: denies dysuria, incontinent of urine MSK:  no joint deformities, has been throwing self to floor from bed or chair for attention, risk for injury is concerning, transfers with 1 assist. Staff making environment safer Skin: no rashes or wounds reported Neurological: Weakness,  Is faking seizures for attention, no real seizures noted. H/o focal seizures  Paulina FusiKathryn Kielan Dreisbach DNP AGPCNP-BC

## 2019-01-19 ENCOUNTER — Non-Acute Institutional Stay: Payer: Medicare Other | Admitting: Primary Care

## 2019-01-19 ENCOUNTER — Other Ambulatory Visit: Payer: Self-pay

## 2019-01-20 ENCOUNTER — Non-Acute Institutional Stay: Payer: Medicare Other | Admitting: Primary Care

## 2019-01-20 ENCOUNTER — Other Ambulatory Visit: Payer: Self-pay

## 2019-01-20 DIAGNOSIS — Z515 Encounter for palliative care: Secondary | ICD-10-CM

## 2019-01-20 NOTE — Progress Notes (Signed)
[facility] Consult Note Telephone: [phone]([phone]  Fax: [phone]  TELEHEALTH VISIT STATEMENT Due to the COVID-19 crisis, this visit was done via telemedicine from my office. It was initiated and consented to by this patient and/or family.  PATIENT NAME: Sherry Crosby DOB: [birth year] MRN: [id number]  PRIMARY CARE PROVIDER:   Drue FlirtHenry-Kalen Ratajczak, Carol, MD [phone]  REFERRING PROVIDER:  Drue FlirtHenry-Icy Fuhrmann, Carol, MD [address] [place],  [place] [address] [phone]  RESPONSIBLE PARTY:   Extended Emergency Contact Information Primary Emergency Contact: Elaina PatteeBunker,Fred L Address: [address]          [place], [place] [id number] [place] Home Phone: [phone] Relation: Spouse Secondary Emergency Contact: Sherrye PayorBunker,Eric  [place] Home Phone: [phone] Relation: Son  Palliative Care was asked to follow this patient by consultation request of Drue FlirtHenry-Amairany Schumpert, Carol, MD. This is a follow up visit.  ASSESSMENT AND RECOMMENDATIONS:   1. Goals of Care: Maximize quality of life and symptom management.  2. Symptom Management:   Agitation: Staff reports physical decline that includes weakness and  is increasingly combative. Often falls due to this agitation despite staff safety measures. Has had several trip to ED after falls. Recommend agitation be addressed as an end of life symptom via hospice approach.  Dysphagia: Seems to be progressing/worsening per staff report. She often refuses large pills and has refused anti seizure medications. Recommend review of medications from a hospice philosophy stand point as trying to take medications is causing patient anxiety and discomfort.  Nutrition: Poor intake, unable to weigh recently but is approximately 83 lbs with 100 lbs on 7/ 2019. This represents a 17% loss.   3. Family /Caregiver/Community Supports: Has been a resident of [facility] years. Has husband and  son and daughter in law who live in town. They have not been able to visit with Covid.   4. Cognitive / Functional decline: Continues to decline with more confusion, combative at times./ Eating less, 25% stated by nursing home staff. Cannot be weighed due to agitation.  5. Advanced Care Directive:  DNR, family has MOST form. Discussed Hospice admission due to debility, decline, poor intake, new dysphagia. Discussed by phone with son and daughter in law. They state they knew this time was coming, and are sad but see the decline. They asked if their son who is going [place]06[place] could see her in person before he goes as he will not be able to return at end of life from [place]. I told family I would advocate for this and that the nursing home where she resides would be arbiter of any visits. I have reached out to Oretha MilchSean Latrail Pounders MD to advise  of my recommendation to admit to hospice after obtaining family agreement.Message was left to return call to discuss.   6. Follow up Palliative Care Visit: Palliative care will continue to follow for goals of care clarification and symptom management. Return 1 week if not admitted to hospice, or prn.  I spent 76 minutes providing this consultation,  from 1100 to 1216. More than 50% of the time in this consultation was spent coordinating communication.   HISTORY OF PRESENT ILLNESS:  Sherry Crosby is a 85 y.o. year old female with multiple medical problems including frequent falls, CHF, depression and  anxiety disorder, hypoxia, seizure disorder,a fib, SSS, muscle weakness, dysphagia, h/o UTI. Palliative Care was asked to help address goals of care.   CODE  STATUS: DNR per son. Uploaded to [facility]  PPS: 30% HOSPICE ELIGIBILITY/DIAGNOSIS: yes/CHF, seizure disorder,  PAST MEDICAL HISTORY:  Past Medical History:  Diagnosis Date  . A-fib ([place])   . Anxiety   . Depression   . High cholesterol   . Hx of degenerative disc disease   . Recurrent UTI    . Seizures ([place])     SOCIAL HX:  Social History   Tobacco Use  . Smoking status: Never Smoker  . Smokeless tobacco: Never Used  Substance Use Topics  . Alcohol use: No    ALLERGIES: No Known Allergies   PERTINENT MEDICATIONS:  Outpatient Encounter Medications as of 01/20/2019  Medication Sig  . acetaminophen (TYLENOL) 325 MG tablet Take 650 mg by mouth every 4 (four) hours as needed for fever.  Marland Kitchen amiodarone (PACERONE) 200 MG tablet Take 100 mg by mouth daily.  . citalopram (CELEXA) 10 MG tablet Take 10 mg by mouth daily.  Marland Kitchen gabapentin (NEURONTIN) 100 MG capsule Take 100 mg by mouth daily.   Marland Kitchen guaifenesin (ROBITUSSIN) 100 MG/5ML syrup Take 100 mg by mouth every 4 (four) hours as needed for cough.  . metoprolol succinate (TOPROL-XL) 25 MG 24 hr tablet Take 0.5 tablets (12.5 mg total) by mouth daily.  Marland Kitchen omeprazole (PRILOSEC) 20 MG capsule Take 20 mg by mouth daily.  . vitamin B-12 (CYANOCOBALAMIN) 1000 MCG tablet Take 1,000 mcg by mouth daily.  Marland Kitchen zonisamide (ZONEGRAN) 100 MG capsule Take 100 mg by mouth daily.    No facility-administered encounter medications on file as of 01/20/2019.     PHYSICAL EXAM/ROS:   Current and past weights: 83 lb on 7/3 and will not permit weights now. She writhes and tries to get out of weight sling General: NAD, frail, very thin , recent CMP unavailable in [facility] chart. Cardiovascular: no chest pain reported, no edema,  Pulmonary: no cough, no increased SOB, Room Air Abdomen: reported dysphagia, appetite 25-50% meals, endorses constipation, incontinent of bowel most of the time. GU:  incontinent of urine MSK:  no joint deformities, ambulates to bathroom with assistance at times,  Skin: no rashes or wounds reported, occ. Skin tears from falls. Neurological: Weakness, combative behaviors. Has h/o of mental illness as well as dementia. She is refusing meds and has a seizure disorder, but no reported seizures as of this writing.  Cyndia Skeeters DNP  AGPCNP-BC

## 2019-02-09 ENCOUNTER — Non-Acute Institutional Stay: Payer: Medicare Other | Admitting: Primary Care

## 2019-02-09 ENCOUNTER — Other Ambulatory Visit: Payer: Self-pay

## 2019-02-09 DIAGNOSIS — Z515 Encounter for palliative care: Secondary | ICD-10-CM

## 2019-02-09 NOTE — Progress Notes (Signed)
[facility] Consult Note Telephone: [phone]  Fax: [phone]   PATIENT NAME: Sherry [place] DOB: 1929-03-17 MRN: [id number]  PRIMARY CARE PROVIDER:   Marisa Hua, MD, [place] [address] [phone]  REFERRING PROVIDER:  Marisa Hua, MD [place],  [place] [address] [phone]  RESPONSIBLE PARTY:   Extended Emergency Contact Information Primary Emergency Contact: Nils Pyle Address: [address]          [place], [place] [id number] Johnnette Litter of [place] Phone: [phone] Relation: Spouse Secondary Emergency Contact: Rondel Oh States of [place] Phone: [phone] Relation: Son   ASSESSMENT AND RECOMMENDATIONS:   1. Advance Care Planning/Goals of Care: Goals include to maximize quality of life and symptom management.DNR on record. Will discuss MOST with family per phone. Had recommended for hospice admission due to decreased intake but currently she is still seen by palliative.  2. Symptom Management:   Nutrition: Intake is poor, unable to weigh. Appears to be adequately nourished and hydrated. Continue to monitor.   Agitation: Seems improved but still has periods of agitation.  3. Family /Caregiver/Community Supports: Family In area (children, husband, grandchildren) but patient states she has no family.Resident of LTC facility for 2 years.   4. Cognitive / Functional decline:   History of dementia and mental health dx. Unable to ambulate. Eating poor to fair quantities.   5. Follow up Palliative Care Visit: Palliative care will continue to follow for goals of care clarification and symptom management. Return 4-6 weeks or prn.  I spent 15 minutes providing this consultation,  from 1030 to 1045. More than 50% of the time in this consultation was spent coordinating communication.   HISTORY OF PRESENT ILLNESS:  Sherry [place] is a 85 y.o. year old  female with multiple medical problems including frequent falls, CHF, depression and  anxiety disorder, hypoxia, seizure disorder,a fib, SSS, muscle weakness, dysphagia, h/o UTI. Palliative Care was asked to follow this patient by consultation request of Marisa Hua, MD to help address advance care planning and goals of care. This is a follow up visit.  CODE STATUS: DNR, to discuss MOST with family.  PPS: 30% HOSPICE ELIGIBILITY/DIAGNOSIS: yes/CHF, seizure disorder PAST MEDICAL HISTORY:  Past Medical History:  Diagnosis Date  . A-fib ([place])   . Anxiety   . Depression   . High cholesterol   . Hx of degenerative disc disease   . Recurrent UTI   . Seizures ([place])     SOCIAL HX:  Social History   Tobacco Use  . Smoking status: Never Smoker  . Smokeless tobacco: Never Used  Substance Use Topics  . Alcohol use: No    ALLERGIES: No Known Allergies   PERTINENT MEDICATIONS:  Outpatient Encounter Medications as of 02/09/2019  Medication Sig  . acetaminophen (TYLENOL) 325 MG tablet Take 650 mg by mouth every 4 (four) hours as needed for fever.  Marland Kitchen amiodarone (PACERONE) 200 MG tablet Take 100 mg by mouth daily.  . citalopram (CELEXA) 10 MG tablet Take 10 mg by mouth daily.  Marland Kitchen gabapentin (NEURONTIN) 100 MG capsule Take 100 mg by mouth daily.   Marland Kitchen guaifenesin (ROBITUSSIN) 100 MG/5ML syrup Take 100 mg by mouth every 4 (four) hours as needed for cough.  . metoprolol succinate (TOPROL-XL) 25 MG 24 hr tablet Take 0.5 tablets (12.5 mg total) by mouth daily.  Marland Kitchen omeprazole (PRILOSEC) 20 MG capsule Take 20 mg by mouth daily.  Marland Kitchen  vitamin B-12 (CYANOCOBALAMIN) 1000 MCG tablet Take 1,000 mcg by mouth daily.  Marland Kitchen. zonisamide (ZONEGRAN) 100 MG capsule Take 100 mg by mouth daily.    No facility-administered encounter medications on file as of 02/09/2019.     PHYSICAL EXAM / ROS:   Current and past weights: 81 lbs on 01/2019. 4 lb loss since 11/2018. General: NAD, frail appearing, thin Cardiovascular:  no chest pain reported, no edema,  Pulmonary: no cough, no increased SOB Abdomen: appetite fair, endorses constipation, incontinent of bowel GU: denies dysuria, incontinent of urine MSK:  no joint deformities, bedbound, bed on low with mats  Skin: no rashes or wounds reported Neurological: Weakness, dementia with behavior disturbances, no seizures reported  Marijo FileKathryn M Teryl Mcconaghy DNP AGPCNP-BC  COVID-19 PATIENT SCREENING TOOL  Person answering questions: _____________Staff______ _____   1.  Is the patient or any family member in the home showing any signs or symptoms regarding respiratory infection?               Person with Symptom- ____NA_______________________  a. Fever                                                                          Yes___ No___          ___________________  b. Shortness of breath                                                    Yes___ No___          ___________________ c. Cough/congestion                                       Yes___  No___         ___________________ d. Body aches/pains                                                         Yes___ No___        ____________________ e. Gastrointestinal symptoms (diarrhea, nausea)           Yes___ No___        ____________________  2. Within the past 14 days, has anyone living in the home had any contact with someone with or under investigation for COVID-19?    Yes___ No_x_   Person __________________

## 2019-03-14 ENCOUNTER — Other Ambulatory Visit: Payer: Self-pay

## 2019-03-14 ENCOUNTER — Non-Acute Institutional Stay: Payer: Medicare Other | Admitting: Primary Care

## 2019-03-14 DIAGNOSIS — Z515 Encounter for palliative care: Secondary | ICD-10-CM

## 2019-03-14 NOTE — Progress Notes (Signed)
[facility] Consult Note Telephone: [phone]  Fax: [phone]  TELEHEALTH VISIT STATEMENT Due to the COVID-19 crisis, this visit was done via telemedicine from my office. It was initiated and consented to by this patient and/or family.  PATIENT NAME: Sherry [place] DOB: 25-Sep-1928 MRN: [id number]  PRIMARY CARE PROVIDER:   Marisa Hua, MD, [place] [address] [phone]  REFERRING PROVIDER:  Marisa Hua, MD [place],  [place] [address] [phone]  RESPONSIBLE PARTY:   Extended Emergency Contact Information Primary Emergency Contact: Sherry [place] Address: [address]          [place], [place] [id number] Johnnette Litter of [place] Phone: [phone] Relation: Spouse Secondary Emergency Contact: Sherry [place] States of [place] Phone: [phone] Relation: Son  ASSESSMENT AND RECOMMENDATIONS:   1. Advance Care Planning/Goals of Care: Goals include to maximize quality of life and symptom management. Patient not willing to talk to RN and NP today, turned and faced wall. Did not respond to questions about comfort, pain, etc. T/c to POA Sherry [place] to discuss any concerns. Left message with need to speak about continued hospice eligibility.  2. Symptom Management:   Anorexia: Staff reports 25% of intake, mostly ice cream. Weights pending. Appears to be declining in intake and function.  Depression: States she wants to die, very flat affect.   3. Family /Caregiver/Community Supports: Family in area, lives in [place].  4. Cognitive / Functional decline: Appears at cognitive baseline. Functionally in bed, has fall mat.  5. Follow up Palliative Care Visit: Palliative care will continue to follow for goals of care clarification and symptom management. Return 2-4 weeks or prn.  I spent 25 minutes providing this consultation,  from 0930 to 0955. More than 50% of the time in this  consultation was spent coordinating communication.   HISTORY OF PRESENT ILLNESS:  Sherry [place] is a 85 y.o. year old female with multiple medical problems including frequent falls, CHF,depression andanxiety disorder, hypoxia, seizure disorder,a fib, SSS, muscle weakness, dysphagia, h/o UTI.  Palliative Care was asked to follow this patient by consultation request of Sherry Hua, MD to help address advance care planning and goals of care. This is a follow up visit.  CODE STATUS: DNR  PPS: 30% HOSPICE ELIGIBILITY/DIAGNOSIS: TBD  PAST MEDICAL HISTORY:  Past Medical History:  Diagnosis Date  . A-fib ([place])   . Anxiety   . Depression   . High cholesterol   . Hx of degenerative disc disease   . Recurrent UTI   . Seizures ([place])     SOCIAL HX:  Social History   Tobacco Use  . Smoking status: Never Smoker  . Smokeless tobacco: Never Used  Substance Use Topics  . Alcohol use: No    ALLERGIES: No Known Allergies   PERTINENT MEDICATIONS:  Outpatient Encounter Medications as of 03/14/2019  Medication Sig  . acetaminophen (TYLENOL) 325 MG tablet Take 650 mg by mouth every 4 (four) hours as needed for fever.  Marland Kitchen amiodarone (PACERONE) 200 MG tablet Take 100 mg by mouth daily.  . citalopram (CELEXA) 10 MG tablet Take 10 mg by mouth daily.  Marland Kitchen gabapentin (NEURONTIN) 100 MG capsule Take 100 mg by mouth daily.   Marland Kitchen guaifenesin (ROBITUSSIN) 100 MG/5ML syrup Take 100 mg by mouth every 4 (four) hours as needed for cough.  . metoprolol succinate (TOPROL-XL) 25 MG 24 hr tablet Take 0.5 tablets (12.5 mg total) by mouth  daily.  . omeprazole (PRILOSEC) 20 MG capsule Take 20 mg by mouth daily.  . vitamin B-12 (CYANOCOBALAMIN) 1000 MCG tablet Take 1,000 mcg by mouth daily.  Marland Kitchen. zonisamide (ZONEGRAN) 100 MG capsule Take 100 mg by mouth daily.    No facility-administered encounter medications on file as of 03/14/2019.     PHYSICAL EXAM / ROS:   Current and past weights: unavailable, TBD  General: NAD, frail appearing, thin, no s/sx pain Cardiovascular: no chest pain reported, no edema Pulmonary: no cough, no increased SOB, Room air Abdomen: appetite poor, 25%, eats ice cream, denies constipation, incontinent of bowel GU: denies dysuria, incontinent of urine MSK:  no joint deformities, non-ambulatory Skin: no rashes or wounds reported Neurological: Weakness, bedbound, waved initially then turned away and would not interact. Floor mat due to falls in the past  Marijo FileKathryn M Keiri Solano DNP AGPCNP-BC

## 2019-03-16 ENCOUNTER — Non-Acute Institutional Stay: Payer: Medicare Other | Admitting: Primary Care

## 2019-04-18 ENCOUNTER — Non-Acute Institutional Stay: Payer: Medicare Other | Admitting: Primary Care

## 2019-04-18 ENCOUNTER — Other Ambulatory Visit: Payer: Self-pay

## 2019-04-18 DIAGNOSIS — Z515 Encounter for palliative care: Secondary | ICD-10-CM

## 2019-04-18 NOTE — Progress Notes (Signed)
[facility] Pall[facility] Consult Note Telephone: [phone]  Fax: [phone]  TELEHEALTH VISIT STATEMENT Due to the COVID-19 crisis, this visit was done via telemedicine from my office. It was initiated and consented to by this patient and/or family.  PATIENT NAME: Sherry [place] [address] [place] [phone] (home)  DOB: [birth year] MRN: [id number]  PRIMARY CARE PROVIDER:   Drue Flirt, MD, [address] [place] [address] [phone]  REFERRING PROVIDER:  Drue Flirt, MD [address] [place],  [place] [address] [phone]  RESPONSIBLE PARTY:   Extended Emergency Contact Information Primary Emergency Contact: Elaina Pattee Address: [address]          [place], [place] [id number] Darden Amber of [place] Home Phone: [phone] Relation: Spouse Secondary Emergency Contact: [name] States of [place] Home Phone: [phone] Relation: Son   ASSESSMENT AND RECOMMENDATIONS:   1. Advance Care Planning/Goals of Care: Goals include to maximize quality of life and symptom management. ACP on file, DNR.  2. Symptom Management:    Agitation: Appears to be at baseline. Has had a few falls and currently has a bruise on her head. She has safety measures in place to decrease her falls.  Dyspnea: Has recently had pneumonia, appears at ease with breathing today  And is interactive.  Continue to monitor for worsening.  3. Family /Caregiver/Community Supports: Family can visit at window visits, lives in LTC.  4. Cognitive / Functional decline: At cognitive baseline, able to get oob in w/c.  5. Follow up Palliative Care Visit: Palliative care will continue to follow for goals of care clarification and symptom management. Return 4-6 weeks or prn.  I spent 15 minutes providing this consultation,  from 1215 to 1230. More than 50% of the time in this consultation was spent coordinating communication.   HISTORY OF PRESENT ILLNESS:  Sherry [place] is a 85 y.o. year old female with multiple medical problems including  frequent falls, CHF,depression andanxiety disorder, hypoxia, seizure disorder,a fib, SSS, muscle weakness, dysphagia, h/o UTI.. Palliative Care was asked to follow this patient by consultation request of Drue Flirt, MD to help address advance care planning and goals of care. This is a follow up visit.  CODE STATUS: DNR  PPS: 30% HOSPICE ELIGIBILITY/DIAGNOSIS: TBD  PAST MEDICAL HISTORY:  Past Medical History:  Diagnosis Date  . A-fib ([facility])   . Anxiety   . Depression   . High cholesterol   . Hx of degenerative disc disease   . Recurrent UTI   . Seizures ([facility])     SOCIAL HX:  Social History   Tobacco Use  . Smoking status: Never Smoker  . Smokeless tobacco: Never Used  Substance Use Topics  . Alcohol use: No    ALLERGIES: No Known Allergies   PERTINENT MEDICATIONS:  Outpatient Encounter Medications as of 04/18/2019  Medication Sig  . acetaminophen (TYLENOL) 325 MG tablet Take 650 mg by mouth every 4 (four) hours as needed for fever.  Marland Kitchen amiodarone (PACERONE) 200 MG tablet Take 100 mg by mouth daily.  . citalopram (CELEXA) 10 MG tablet Take 10 mg by mouth daily.  Marland Kitchen gabapentin (NEURONTIN) 100 MG capsule Take 100 mg by mouth daily.   Marland Kitchen guaifenesin (ROBITUSSIN) 100 MG/5ML syrup Take 100 mg by mouth every 4 (four) hours as needed for cough.  . metoprolol succinate (TOPROL-XL) 25 MG 24 hr tablet Take 0.5 tablets (12.5 mg total) by mouth daily.  Marland Kitchen omeprazole (  PRILOSEC) 20 MG capsule Take 20 mg by mouth daily.  . vitamin B-12 (CYANOCOBALAMIN) 1000 MCG tablet Take 1,000 mcg by mouth daily.  Marland Kitchen zonisamide (ZONEGRAN) 100 MG capsule Take 100 mg by mouth daily.    No facility-administered encounter medications on file as of 04/18/2019.     PHYSICAL EXAM / ROS:   97.6-74-18  132/66 Oxygen sat 97% room air Current and past weights: 82.3 lb on 04/03/19, 81 lbs on  01/2019. 4 lb loss since 11/2018. General: NAD, frail appearing, thin Cardiovascular: no chest pain reported, no edema reported  Pulmonary: no cough, no increased SOB Abdomen: appetite good, 75%, eating ice cream on exam, denies constipation, incontinent of bowel GU: denies dysuria, incontinent of urine  MSK:  no joint deformities, non ambulatory, frequent falls Skin: no rashes or wounds reported, abrasion from fall on forehead Neurological: Weakness, sleep adequate, denies pain  Cyndia Skeeters DNP, MPH, AGPCNP-PC

## 2019-04-20 ENCOUNTER — Non-Acute Institutional Stay: Payer: Medicare Other | Admitting: Primary Care

## 2019-05-18 ENCOUNTER — Non-Acute Institutional Stay: Payer: Medicare Other | Admitting: Primary Care

## 2019-05-18 ENCOUNTER — Other Ambulatory Visit: Payer: Self-pay

## 2019-05-18 DIAGNOSIS — Z515 Encounter for palliative care: Secondary | ICD-10-CM

## 2019-05-18 NOTE — Progress Notes (Signed)
[facility] Consult Note Telephone: [phone]  Fax: [phone]  TELEHEALTH VISIT STATEMENT Due to the COVID-19 crisis, this visit was done via telemedicine from my office. It was initiated and consented to by this patient and/or family.  PATIENT NAME: Sherry [place] [address] [phone] (home)  DOB: [birth year] MRN: [id number]  PRIMARY CARE PROVIDER:   Marisa Hua, MD, [place] [address] [phone]  REFERRING PROVIDER:  Marisa Hua, MD [place],  [place] [address] [phone]  RESPONSIBLE PARTY:   Extended Emergency Contact Information Primary Emergency Contact: Nils Pyle Address: [address]          [place], [place] [id number] Johnnette Litter of [place] Phone: [phone] Relation: Spouse Secondary Emergency Contact: Rondel Oh States of [place] Phone: [phone] Relation: Son   ASSESSMENT AND RECOMMENDATIONS:   1. Advance Care Planning/Goals of Care: Goals include to maximize quality of life and symptom management. Patient has DNR order.  2. Symptom Management:   Pain: No ATC pain medications or prn pain medicines on MAR. Recommend ATC Acetaminophen 650 mg q 8hrs. Also has been on SSRI in past but no longer on MAR. Recommend starting celexa 10 mg or sertraline 25 mg for mood and adjuvant to pain control.  Nutrition: Having ice cream mostly, often does not eat other food. Weight is 82 lbs but has been stable  3. Family /Caregiver/Community Supports: Has family in the area, lives in [facility] facility.  4. Cognitive / Functional decline: Alert, closes eyes to disengage. Finally begins to talk. Is dependent in all adl and iadls.  5. Follow up Palliative Care Visit: Palliative care will continue to follow for goals of care clarification and symptom management. Return 4-6 weeks or prn.  I spent 25 minutes providing this  consultation,  from 1200 to 1225. More than 50% of the time in this consultation was spent coordinating communication.   HISTORY OF PRESENT ILLNESS:  Sherry [place] is a 85 y.o. year old female with multiple medical problems including pain, dementia, protein calorie malnutrition. Palliative Care was asked to follow this patient by consultation request of Marisa Hua, MD to help address advance care planning and goals of care. This is a follow up visit.  CODE STATUS: DNR  PPS: 30% HOSPICE ELIGIBILITY/DIAGNOSIS: TBD  PAST MEDICAL HISTORY:  Past Medical History:  Diagnosis Date  . A-fib ([place])   . Anxiety   . Depression   . High cholesterol   . Hx of degenerative disc disease   . Recurrent UTI   . Seizures ([place])     SOCIAL HX:  Social History   Tobacco Use  . Smoking status: Never Smoker  . Smokeless tobacco: Never Used  Substance Use Topics  . Alcohol use: No    ALLERGIES: No Known Allergies   PERTINENT MEDICATIONS:  Outpatient Encounter Medications as of 05/18/2019  Medication Sig  . acetaminophen (TYLENOL) 325 MG tablet Take 650 mg by mouth every 4 (four) hours as needed for fever.  Marland Kitchen amiodarone (PACERONE) 200 MG tablet Take 100 mg by mouth daily.  . citalopram (CELEXA) 10 MG tablet Take 10 mg by mouth daily.  Marland Kitchen gabapentin (NEURONTIN) 100 MG capsule Take 100 mg by mouth daily.   Marland Kitchen guaifenesin (ROBITUSSIN) 100 MG/5ML syrup Take 100 mg by mouth every 4 (four) hours as needed for cough.  . metoprolol succinate (TOPROL-XL) 25 MG 24 hr tablet Take  0.5 tablets (12.5 mg total) by mouth daily.  Marland Kitchen omeprazole (PRILOSEC) 20 MG capsule Take 20 mg by mouth daily.  . vitamin B-12 (CYANOCOBALAMIN) 1000 MCG tablet Take 1,000 mcg by mouth daily.  Marland Kitchen zonisamide (ZONEGRAN) 100 MG capsule Take 100 mg by mouth daily.    No facility-administered encounter medications on file as of 05/18/2019.     PHYSICAL EXAM / ROS:  VSS Current and past weights: 82.4 lbs, stable weight x several  months General: NAD, frail appearing, thin, states feeling bad that someone hid her clothing or give her food. She has on hospital gown.c/o pain but no ATC meds. Cardiovascular: no chest pain reported, no edema Pulmonary: no cough, no increased SOB, room air Abdomen: appetite 50%, endorses constipation, incontinent of bowel GU: denies dysuria, incontinent of urine MSK:  no joint deformities, non ambulatory, in bed which is lowered for fall risk Skin: no rashes or wounds reported Neurological: Weakness, refusing care per staff, won't open eyes at first but then begins to interact.   Marijo File DNP AGPCNP-BC

## 2019-07-06 ENCOUNTER — Observation Stay
Admission: EM | Admit: 2019-07-06 | Discharge: 2019-07-08 | Disposition: A | Discharge status: Skilled Nursing Facility | Payer: Medicare Other | Attending: Internal Medicine | Admitting: Internal Medicine

## 2019-07-06 ENCOUNTER — Other Ambulatory Visit: Payer: Self-pay

## 2019-07-06 ENCOUNTER — Emergency Department: Payer: Medicare Other

## 2019-07-06 DIAGNOSIS — I482 Chronic atrial fibrillation, unspecified: Secondary | ICD-10-CM

## 2019-07-06 DIAGNOSIS — I4891 Unspecified atrial fibrillation: Secondary | ICD-10-CM | POA: Diagnosis present

## 2019-07-06 DIAGNOSIS — J90 Pleural effusion, not elsewhere classified: Secondary | ICD-10-CM | POA: Insufficient documentation

## 2019-07-06 DIAGNOSIS — R0902 Hypoxemia: Secondary | ICD-10-CM | POA: Insufficient documentation

## 2019-07-06 DIAGNOSIS — G40909 Epilepsy, unspecified, not intractable, without status epilepticus: Secondary | ICD-10-CM | POA: Diagnosis not present

## 2019-07-06 DIAGNOSIS — E43 Unspecified severe protein-calorie malnutrition: Secondary | ICD-10-CM | POA: Insufficient documentation

## 2019-07-06 DIAGNOSIS — E559 Vitamin D deficiency, unspecified: Secondary | ICD-10-CM | POA: Insufficient documentation

## 2019-07-06 DIAGNOSIS — F0390 Unspecified dementia without behavioral disturbance: Secondary | ICD-10-CM | POA: Insufficient documentation

## 2019-07-06 DIAGNOSIS — S0101XA Laceration without foreign body of scalp, initial encounter: Secondary | ICD-10-CM | POA: Insufficient documentation

## 2019-07-06 DIAGNOSIS — I480 Paroxysmal atrial fibrillation: Secondary | ICD-10-CM | POA: Diagnosis not present

## 2019-07-06 DIAGNOSIS — W19XXXA Unspecified fall, initial encounter: Secondary | ICD-10-CM | POA: Insufficient documentation

## 2019-07-06 DIAGNOSIS — F329 Major depressive disorder, single episode, unspecified: Secondary | ICD-10-CM | POA: Diagnosis not present

## 2019-07-06 DIAGNOSIS — Z87440 Personal history of urinary (tract) infections: Secondary | ICD-10-CM | POA: Insufficient documentation

## 2019-07-06 DIAGNOSIS — R627 Adult failure to thrive: Secondary | ICD-10-CM | POA: Diagnosis not present

## 2019-07-06 DIAGNOSIS — J690 Pneumonitis due to inhalation of food and vomit: Secondary | ICD-10-CM | POA: Insufficient documentation

## 2019-07-06 DIAGNOSIS — E785 Hyperlipidemia, unspecified: Secondary | ICD-10-CM | POA: Insufficient documentation

## 2019-07-06 DIAGNOSIS — F419 Anxiety disorder, unspecified: Secondary | ICD-10-CM | POA: Diagnosis not present

## 2019-07-06 DIAGNOSIS — S0990XA Unspecified injury of head, initial encounter: Secondary | ICD-10-CM | POA: Insufficient documentation

## 2019-07-06 DIAGNOSIS — Z823 Family history of stroke: Secondary | ICD-10-CM | POA: Insufficient documentation

## 2019-07-06 DIAGNOSIS — Z8249 Family history of ischemic heart disease and other diseases of the circulatory system: Secondary | ICD-10-CM | POA: Insufficient documentation

## 2019-07-06 DIAGNOSIS — D696 Thrombocytopenia, unspecified: Secondary | ICD-10-CM | POA: Insufficient documentation

## 2019-07-06 DIAGNOSIS — I6782 Cerebral ischemia: Secondary | ICD-10-CM | POA: Diagnosis not present

## 2019-07-06 DIAGNOSIS — R7989 Other specified abnormal findings of blood chemistry: Secondary | ICD-10-CM | POA: Diagnosis present

## 2019-07-06 DIAGNOSIS — I495 Sick sinus syndrome: Secondary | ICD-10-CM | POA: Insufficient documentation

## 2019-07-06 DIAGNOSIS — I110 Hypertensive heart disease with heart failure: Secondary | ICD-10-CM | POA: Insufficient documentation

## 2019-07-06 DIAGNOSIS — Z79899 Other long term (current) drug therapy: Secondary | ICD-10-CM | POA: Diagnosis not present

## 2019-07-06 DIAGNOSIS — Z20822 Contact with and (suspected) exposure to covid-19: Secondary | ICD-10-CM | POA: Insufficient documentation

## 2019-07-06 DIAGNOSIS — I5031 Acute diastolic (congestive) heart failure: Secondary | ICD-10-CM | POA: Insufficient documentation

## 2019-07-06 DIAGNOSIS — E7800 Pure hypercholesterolemia, unspecified: Secondary | ICD-10-CM | POA: Diagnosis not present

## 2019-07-06 DIAGNOSIS — Z833 Family history of diabetes mellitus: Secondary | ICD-10-CM | POA: Insufficient documentation

## 2019-07-06 LAB — CBC WITH DIFFERENTIAL/PLATELET
Abs Immature Granulocytes: 0.08 10*3/uL — ABNORMAL HIGH (ref 0.00–0.07)
Basophils Absolute: 0.1 10*3/uL (ref 0.0–0.1)
Basophils Relative: 1 %
Eosinophils Absolute: 0 10*3/uL (ref 0.0–0.5)
Eosinophils Relative: 0 %
HCT: 46 % (ref 36.0–46.0)
Hemoglobin: 14.4 g/dL (ref 12.0–15.0)
Immature Granulocytes: 1 %
Lymphocytes Relative: 7 %
Lymphs Abs: 0.6 10*3/uL — ABNORMAL LOW (ref 0.7–4.0)
MCH: 29.9 pg (ref 26.0–34.0)
MCHC: 31.3 g/dL (ref 30.0–36.0)
MCV: 95.4 fL (ref 80.0–100.0)
Monocytes Absolute: 0.3 10*3/uL (ref 0.1–1.0)
Monocytes Relative: 4 %
Neutro Abs: 7.8 10*3/uL — ABNORMAL HIGH (ref 1.7–7.7)
Neutrophils Relative %: 87 %
Platelets: 83 10*3/uL — ABNORMAL LOW (ref 150–400)
RBC: 4.82 MIL/uL (ref 3.87–5.11)
RDW: 13.8 % (ref 11.5–15.5)
WBC: 8.8 10*3/uL (ref 4.0–10.5)
nRBC: 0 % (ref 0.0–0.2)

## 2019-07-06 LAB — COMPREHENSIVE METABOLIC PANEL
ALT: 10 U/L (ref 0–44)
AST: 14 U/L — ABNORMAL LOW (ref 15–41)
Albumin: 2.9 g/dL — ABNORMAL LOW (ref 3.5–5.0)
Alkaline Phosphatase: 55 U/L (ref 38–126)
Anion gap: 13 (ref 5–15)
BUN: 44 mg/dL — ABNORMAL HIGH (ref 8–23)
CO2: 22 mmol/L (ref 22–32)
Calcium: 9.7 mg/dL (ref 8.9–10.3)
Chloride: 107 mmol/L (ref 98–111)
Creatinine, Ser: 1.19 mg/dL — ABNORMAL HIGH (ref 0.44–1.00)
GFR calc Af Amer: 47 mL/min — ABNORMAL LOW (ref >60)
GFR calc non Af Amer: 41 mL/min — ABNORMAL LOW (ref >60)
Glucose, Bld: 156 mg/dL — ABNORMAL HIGH (ref 70–99)
Potassium: 3.6 mmol/L (ref 3.5–5.1)
Sodium: 142 mmol/L (ref 135–145)
Total Bilirubin: 0.8 mg/dL (ref 0.3–1.2)
Total Protein: 6.4 g/dL — ABNORMAL LOW (ref 6.5–8.1)

## 2019-07-06 LAB — RESPIRATORY PANEL BY RT PCR (FLU A&B, COVID)
Influenza A by PCR: NEGATIVE
Influenza B by PCR: NEGATIVE
SARS Coronavirus 2 by RT PCR: NEGATIVE

## 2019-07-06 LAB — LACTIC ACID, PLASMA
Lactic Acid, Venous: 2.1 mmol/L (ref 0.5–1.9)
Lactic Acid, Venous: 2.7 mmol/L (ref 0.5–1.9)

## 2019-07-06 LAB — TROPONIN I (HIGH SENSITIVITY)
Troponin I (High Sensitivity): 42 ng/L — ABNORMAL HIGH (ref <18)
Troponin I (High Sensitivity): 43 ng/L — ABNORMAL HIGH (ref <18)

## 2019-07-06 LAB — PROCALCITONIN: Procalcitonin: 10.25 ng/mL

## 2019-07-06 MED ORDER — ZONISAMIDE 100 MG PO CAPS
100.0000 mg | ORAL_CAPSULE | Freq: Every day | ORAL | Status: DC
  Administered 2019-07-07 – 2019-07-08 (×2): 100 mg via ORAL
  Filled 2019-07-06 (×3): qty 1

## 2019-07-06 MED ORDER — ACETAMINOPHEN 650 MG RE SUPP: 650.0000 mg | Freq: Four times a day (QID) | RECTAL | Status: DC | PRN

## 2019-07-06 MED ORDER — SODIUM CHLORIDE 0.9% FLUSH
3.0000 mL | Freq: Two times a day (BID) | INTRAVENOUS | Status: DC
  Administered 2019-07-07 – 2019-07-08 (×2): 3 mL via INTRAVENOUS

## 2019-07-06 MED ORDER — DIVALPROEX SODIUM 125 MG PO DR TAB
125.0000 mg | DELAYED_RELEASE_TABLET | Freq: Three times a day (TID) | ORAL | Status: DC
  Administered 2019-07-07 – 2019-07-08 (×3): 125 mg via ORAL
  Filled 2019-07-06 (×8): qty 1

## 2019-07-06 MED ORDER — SODIUM CHLORIDE 0.9 % IV BOLUS
500.0000 mL | Freq: Once | INTRAVENOUS | Status: AC
  Administered 2019-07-06: 23:00:00 500 mL via INTRAVENOUS

## 2019-07-06 MED ORDER — AMIODARONE HCL 200 MG PO TABS
100.0000 mg | ORAL_TABLET | Freq: Every day | ORAL | Status: DC
  Administered 2019-07-07 – 2019-07-08 (×2): 100 mg via ORAL
  Filled 2019-07-06 (×3): qty 1

## 2019-07-06 MED ORDER — METOPROLOL SUCCINATE ER 25 MG PO TB24
12.5000 mg | ORAL_TABLET | Freq: Every day | ORAL | Status: DC
  Administered 2019-07-07 – 2019-07-08 (×2): 12.5 mg via ORAL
  Filled 2019-07-06: qty 1
  Filled 2019-07-06: qty 0.5
  Filled 2019-07-06: qty 1

## 2019-07-06 MED ORDER — SODIUM CHLORIDE 0.9 % IV SOLN
2.0000 g | Freq: Once | INTRAVENOUS | Status: AC
  Administered 2019-07-06: 21:00:00 2 g via INTRAVENOUS
  Filled 2019-07-06: qty 2

## 2019-07-06 MED ORDER — SODIUM CHLORIDE 0.9 % IV BOLUS
250.0000 mL | Freq: Once | INTRAVENOUS | Status: AC
  Administered 2019-07-06: 16:00:00 250 mL via INTRAVENOUS

## 2019-07-06 MED ORDER — AMIODARONE LOAD VIA INFUSION: 150.0000 mg | Freq: Once | INTRAVENOUS | Status: DC

## 2019-07-06 MED ORDER — LEVETIRACETAM 100 MG/ML PO SOLN
1000.0000 mg | Freq: Two times a day (BID) | ORAL | Status: DC
  Administered 2019-07-07 – 2019-07-08 (×4): 1000 mg via ORAL
  Filled 2019-07-06 (×7): qty 10

## 2019-07-06 MED ORDER — VANCOMYCIN HCL IN DEXTROSE 1-5 GM/200ML-% IV SOLN
1000.0000 mg | Freq: Once | INTRAVENOUS | Status: DC
  Administered 2019-07-06: 23:00:00 1000 mg via INTRAVENOUS
  Filled 2019-07-06: qty 200

## 2019-07-06 MED ORDER — ACETAMINOPHEN 325 MG PO TABS: 650.0000 mg | ORAL_TABLET | Freq: Four times a day (QID) | ORAL | Status: DC | PRN

## 2019-07-06 MED ORDER — AMIODARONE IV BOLUS ONLY 150 MG/100ML
150.0000 mg | Freq: Once | INTRAVENOUS | Status: AC
  Administered 2019-07-06: 15:00:00 150 mg via INTRAVENOUS
  Filled 2019-07-06: qty 100

## 2019-07-06 MED ORDER — METRONIDAZOLE IN NACL 5-0.79 MG/ML-% IV SOLN
500.0000 mg | Freq: Once | INTRAVENOUS | Status: AC
  Administered 2019-07-06: 21:00:00 500 mg via INTRAVENOUS
  Filled 2019-07-06: qty 100

## 2019-07-06 NOTE — ED Notes (Signed)
Report to include Situation, Background, Assessment, and Recommendations received from [facility]. Patient is asleep, warm and dry, in no acute distress.  Bed in lowest position, call light within reach.  Will continue to monitor.

## 2019-07-06 NOTE — ED Triage Notes (Signed)
Pt arrives via EMS from [facility] for an unwitnessed fall- pt has a hx of dementia- pt has a small lac to the right side of her head on the top- per EMS pt has a hx of afib and was showing afib on their monitor at 145

## 2019-07-06 NOTE — ED Notes (Signed)
Patient off unit to CT

## 2019-07-06 NOTE — ED Provider Notes (Signed)
3:31 PM Assumed care for off going team.   Blood pressure 121/64, pulse (!) 151, temperature 98.7 F (37.1 C), temperature source Rectal, resp. rate (!) 21, height 5\' 5"  (1.651 m), weight 45.4 kg, SpO2 100 %.  See their HPI for full report but in brief Unwitnessed fall-small scalp lac repaired. Afib did not take amio today. DNAR, dementia. If HR comes down plan to d/c.   D/w pts daughter in law who helps make medical decisions. Pt lives at [facility] and is non ambulatory for years.  We discussed that HR has come down and pt labs are re-assuring. CT head did show new but chronic infarcts. I d/w daughter in law that most likely due to afib. We discussed pro/cons of anticoagulation for afib but given high fall risk elected to hold on [facility].    Chest x-ray was concerning for possible pneumonia versus atelectasis.  I added on a procalcitonin that was significantly elevated at greater than 10.  Patient does not appear septic given she is afebrile and has a normal white count.  However she was significantly tachycardic.  I recalled patient's family to discuss goals of care with them.  We discussed admission versus going home on oral antibiotics.  They would prefer patient to be admitted.  They also stated that there has been some positive coronavirus cases at her facility so we will test her for this as well.  Will discuss with the hospital team for admission and placed on broad-spectrum antibiotics at this time.         [facility], MD 07/06/19 2012

## 2019-07-06 NOTE — ED Provider Notes (Signed)
[facility] Emergency Department Provider Note       Time seen: ----------------------------------------- 2:26 PM on 07/06/2019 -----------------------------------------   I have reviewed the triage vital signs and the nursing notes.  HISTORY   Chief Complaint Fall    HPI Sherry Crosby is a 86 y.o. female with a history of atrial fibrillation, anxiety, depression, hyperlipidemia, UTI, seizures, dementia who presents to the ED for unwitnessed fall.  Patient is a history of dementia and cannot give further review of systems or report.  She was noted to have a laceration on her scalp.  She was also noted to be in rapid A. fib.  Past Medical History:  Diagnosis Date  . A-fib (HCC)   . Anxiety   . Depression   . High cholesterol   . Hx of degenerative disc disease   . Recurrent UTI   . Seizures [facility])     Patient Active Problem List   Diagnosis Date Noted  . Sick sinus syndrome (HCC) 03/20/2016  . Cystitis 03/20/2016  . Hypoxia 03/20/2016  . Acute diastolic CHF (congestive heart failure) (HCC) 03/20/2016  . Left foot pain 03/20/2016  . A-fib (HCC) 03/10/2016    Past Surgical History:  Procedure Laterality Date  . cataract surgery    . Tonsillectomy      Allergies Patient has no known allergies.  Social History Social History   Tobacco Use  . Smoking status: Never Smoker  . Smokeless tobacco: Never Used  Substance Use Topics  . Alcohol use: No  . Drug use: No   Review of Systems Unable to give further review of systems or report  All systems negative/normal/unremarkable except as stated in the HPI  ____________________________________________   PHYSICAL EXAM:  VITAL SIGNS: ED Triage Vitals  Enc Vitals Group     BP 07/06/19 1418 (!) 125/94     Pulse --      Resp 07/06/19 1418 16     Temp --      Temp src --      SpO2 --      Weight 07/06/19 1419 100 lb (45.4 kg)     Height 07/06/19 1419 5\' 5"  (1.651 m)     Head  Circumference --      Peak Flow --      Pain Score --      Pain Loc --      Pain Edu? --      Excl. in GC? --    Constitutional: Alert but disoriented, no obvious distress Eyes: Conjunctivae are normal. Normal extraocular movements. ENT      Head: Normocephalic, midline scalp laceration above the hairline of 1 cm and superficial      Nose: No congestion/rhinnorhea.      Mouth/Throat: Mucous membranes are moist.      Neck: No stridor. Cardiovascular: Irregularly irregular rhythm. No murmurs, rubs, or gallops. Respiratory: Normal respiratory effort without tachypnea nor retractions. Breath sounds are clear and equal bilaterally. No wheezes/rales/rhonchi. Gastrointestinal: Soft and nontender. Normal bowel sounds Musculoskeletal: Nontender with normal range of motion in extremities. No lower extremity tenderness nor edema. Neurologic:  No gross focal neurologic deficits are appreciated.  Skin: Scalp laceration as noted above Psychiatric: Flat affect ____________________________________________  EKG: Interpreted by me.  Atrial fibrillation with rapid ventricular response, rate is 163 bpm, PVC, normal axis, normal QT  ____________________________________________  ED COURSE:  As part of my medical decision making, I reviewed the following data within the electronic MEDICAL RECORD NUMBER  History obtained from family if available, nursing notes, old chart and ekg, as well as notes from prior ED visits. Patient presented for an unwitnessed fall, we will assess with labs and imaging as indicated at this time.   Marland Kitchen.Laceration Repair  Date/Time: 07/06/2019 3:37 PM Performed by: Earleen Newport, MD Authorized by: Earleen Newport, MD   Consent:    Consent obtained:  Emergent situation Anesthesia (see MAR for exact dosages):    Anesthesia method:  None Laceration details:    Location:  Scalp   Scalp location:  Mid-scalp   Length (cm):  1   Depth (mm):  3 Repair type:    Repair type:   Simple Treatment:    Irrigation solution:  Tap water Skin repair:    Repair method:  Tissue adhesive    Sherry Crosby was evaluated in Emergency Department on 07/06/2019 for the symptoms described in the history of present illness. She was evaluated in the context of the global COVID-19 pandemic, which necessitated consideration that the patient might be at risk for infection with the SARS-CoV-2 virus that causes COVID-19. Institutional protocols and algorithms that pertain to the evaluation of patients at risk for COVID-19 are in a state of rapid change based on information released by regulatory bodies including the CDC and federal and state organizations. These policies and algorithms were followed during the patient's care in the ED.  ____________________________________________   LABS (pertinent positives/negatives)  Labs Reviewed  CBC WITH DIFFERENTIAL/PLATELET  COMPREHENSIVE METABOLIC PANEL  URINALYSIS, COMPLETE (UACMP) WITH MICROSCOPIC  PROCALCITONIN  TROPONIN I (HIGH SENSITIVITY)    RADIOLOGY Images were viewed by me  CT head, chest x-ray IMPRESSION:  1. Small bilateral pleural effusions.  2. Hazy bibasilar lung opacities, favor atelectasis, difficult to  exclude a component of aspiration or pneumonia.  IMPRESSION:  No evidence of acute intracranial injury.   New chronic infarcts and progression of chronic microvascular  ischemic changes and parenchymal volume loss as detailed above.  ____________________________________________   DIFFERENTIAL DIAGNOSIS   Arrhythmia, MI, vasovagal event, subdural, fracture, anemia, occult infection  FINAL ASSESSMENT AND PLAN  Dementia, fall, head injury, rapid atrial fibrillation   Plan: The patient had presented for an unwitnessed fall. Patient's labs are still pending at this time. Patient's imaging revealed small bilateral pleural effusions and likely atelectasis.  She does not have any acute intracranial injury.  She did  receive IV amiodarone for rapid A. fib which she has improved her heart rate.  Final disposition is pending at this time.   Laurence Aly, MD    Note: This note was generated in part or whole with voice recognition software. Voice recognition is usually quite accurate but there are transcription errors that can and very often do occur. I apologize for any typographical errors that were not detected and corrected.     Earleen Newport, MD 07/06/19 1538

## 2019-07-06 NOTE — H&P (Signed)
History and Physical    Sherry MINI VPX:[id number] DOB: 1929-01-21 DOA: 07/06/2019  PCP: Marisa Hua, MD  Patient coming from: [place]  I have personally briefly reviewed patient's old medical records in [place]  Chief Complaint: Unwitnessed fall  HPI: Sherry Crosby is a 86 y.o. female with medical history significant for dementia, atrial fibrillation not on anticoagulation, seizure disorder, hyperlipidemia, depression with anxiety who presents to the ED from her facility for evaluation after an unwitnessed fall.  Unable to obtain history from patient due to her dementia therefore entire history is obtained from Sherry Crosby, chart review, and daughter-in-law by phone  Patient currently lives at [facility] and per daughter-in-law is completely nonambulatory.  She requires full assist with ADLs and IADLs.  She had reported unwitnessed fall at her facility.  She was noted to have a laceration at the right side of her scalp.  She was brought to the ED via EMS for further evaluation.  On EMS evaluation she was noted to be in atrial fibrillation with rapid ventricular rate at 145.  ED Course:  Initial vitals showed BP 121/64, pulse 160, RR 21, temp 98.7 Fahrenheit rectally, SPO2 100% on room air.  Labs notable for WBC 8.8, hemoglobin 14.4, platelets 83,000, sodium 142, potassium 3.6, bicarb 22, BUN 44, creatinine 1.19, high-sensitivity troponin I 42 and 43, procalcitonin 10.25, lactic acid 2.1.  Urinalysis was ordered and pending.  Blood culture were obtained and pending.  Respiratory panel was negative for SARS-CoV-2 and influenza A/B PCR's.  CT head without contrast was negative for acute intracranial injury.  Interval new chronic infarct and progression of chronic microvascular ischemic changes and parenchymal volume loss are noted.  Portable chest x-ray showed small bilateral pleural effusions and hazy bibasilar lung opacities.  Patient was given  IV amiodarone 150 mg due to A. fib with RVR with improvement in rate.  She was also given 250 cc normal saline and started on IV vancomycin, cefepime, and metronidazole.  Hospitalist service was consulted for further evaluation and management.  Review of Systems:  Unable to obtain full review of systems due to patient's dementia.   Past Medical History:  Diagnosis Date  . A-fib ([place])   . Anxiety   . Depression   . High cholesterol   . Hx of degenerative disc disease   . Recurrent UTI   . Seizures ([place])     Past Surgical History:  Procedure Laterality Date  . cataract surgery    . Tonsillectomy      Social History:  reports that she has never smoked. She has never used smokeless tobacco. She reports that she does not drink alcohol or use drugs.  No Known Allergies  Family History  Problem Relation Age of Onset  . Diabetes Mother   . Hypertension Mother   . Stroke Mother   . Heart disease Father      Prior to Admission medications   Medication Sig Start Date End Date Taking? Authorizing Provider  acetaminophen (TYLENOL) 325 MG tablet Take 650 mg by mouth every 4 (four) hours as needed for fever.    [provider]  amiodarone (PACERONE) 200 MG tablet Take 100 mg by mouth daily.    [provider]  citalopram (CELEXA) 10 MG tablet Take 10 mg by mouth daily.    [provider]  divalproex (DEPAKOTE) 125 MG DR tablet Take 125 mg by mouth 3 (three) times daily.    [provider]  gabapentin (NEURONTIN) 100 MG capsule Take 100 mg by mouth daily.     [provider]  guaifenesin (ROBITUSSIN) 100 MG/5ML syrup Take 100 mg by mouth every 4 (four) hours as needed for cough.    [provider]  levETIRAcetam (KEPPRA) 100 MG/ML solution Take 1,000 mg by mouth 2 (two) times daily.    [provider]  LORazepam (ATIVAN) 0.5 MG tablet Take 0.5 mg by mouth 2 (two) times daily.    [provider]  LORazepam (ATIVAN)  2 MG/ML concentrated solution Take 0.5 mg by mouth daily as needed for anxiety. For restlessness and agitation    [provider]  metoprolol succinate (TOPROL-XL) 25 MG 24 hr tablet Take 0.5 tablets (12.5 mg total) by mouth daily. 03/21/16   Sherry Caper, MD  omeprazole (PRILOSEC) 20 MG capsule Take 20 mg by mouth daily.    [provider]  vitamin B-12 (CYANOCOBALAMIN) 1000 MCG tablet Take 1,000 mcg by mouth daily.    [provider]  zonisamide (ZONEGRAN) 100 MG capsule Take 100 mg by mouth daily.     [provider]    Physical Exam: Vitals:   07/06/19 1800 07/06/19 1920 07/06/19 2000 07/06/19 2100  BP: (!) 102/57 (!) 110/59 (!) 112/53 (!) 113/96  Pulse: 62 63 60   Resp: 19 18 18 15   Temp:      TempSrc:      SpO2: 98% 97% 99%   Weight:      Height:       Exam limited due to dementia. Constitutional: Thin elderly woman resting supine in bed, NAD, calm, comfortable Eyes: Keeps right eye closed, left eye pupil is round and reactive ENMT: Mucous membranes are moist. Posterior pharynx clear of any exudate or lesions. Neck: normal, supple, no masses. Respiratory: clear to auscultation bilaterally, no wheezing, no crackles. Normal respiratory effort. No accessory muscle use.  Cardiovascular: Regular rate and rhythm, no murmurs / rubs / gallops. No extremity edema. 2+ pedal pulses. Abdomen: no tenderness, no masses palpated. No hepatosplenomegaly. Bowel sounds positive.  Musculoskeletal: no clubbing / cyanosis. No joint deformity upper and lower extremities.  Moving upper extremities spontaneously. Skin: Right upper scalp laceration without open wound, surrounded by dried blood Neurologic: Exam limited due to dementia.  Moving upper extremities spontaneously.  Sensation appears intact. Psychiatric: Awake but not oriented or able to provide relevant history.   Labs on Admission: I have personally reviewed following labs and imaging  studies  CBC: Recent Labs  Lab 07/06/19 1508  WBC 8.8  NEUTROABS 7.8*  HGB 14.4  HCT 46.0  MCV 95.4  PLT 83*   Basic Metabolic Panel: Recent Labs  Lab 07/06/19 1508  NA 142  K 3.6  CL 107  CO2 22  GLUCOSE 156*  BUN 44*  CREATININE 1.19*  CALCIUM 9.7   GFR: Estimated Creatinine Clearance: 23.4 mL/min (A) (by C-G formula based on SCr of 1.19 mg/dL (H)). Liver Function Tests: Recent Labs  Lab 07/06/19 1508  AST 14*  ALT 10  ALKPHOS 55  BILITOT 0.8  PROT 6.4*  ALBUMIN 2.9*   No results for input(s): LIPASE, AMYLASE in the last 168 hours. No results for input(s): AMMONIA in the last 168 hours. Coagulation Profile: No results for input(s): INR, PROTIME in the last 168 hours. Cardiac Enzymes: No results for input(s): CKTOTAL, CKMB, CKMBINDEX, TROPONINI in the last 168 hours. BNP (last 3 results) No results for input(s): PROBNP in the last 8760 hours. HbA1C: No results  for input(s): HGBA1C in the last 72 hours. CBG: No results for input(s): GLUCAP in the last 168 hours. Lipid Profile: No results for input(s): CHOL, HDL, LDLCALC, TRIG, CHOLHDL, LDLDIRECT in the last 72 hours. Thyroid Function Tests: No results for input(s): TSH, T4TOTAL, FREET4, T3FREE, THYROIDAB in the last 72 hours. Anemia Panel: No results for input(s): VITAMINB12, FOLATE, FERRITIN, TIBC, IRON, RETICCTPCT in the last 72 hours. Urine analysis:    Component Value Date/Time   COLORURINE YELLOW (A) 12/27/2017 1539   APPEARANCEUR CLEAR (A) 12/27/2017 1539   LABSPEC 1.012 12/27/2017 1539   PHURINE 7.0 12/27/2017 1539   GLUCOSEU NEGATIVE 12/27/2017 1539   HGBUR SMALL (A) 12/27/2017 1539   BILIRUBINUR NEGATIVE 12/27/2017 1539   KETONESUR NEGATIVE 12/27/2017 1539   PROTEINUR NEGATIVE 12/27/2017 1539   NITRITE NEGATIVE 12/27/2017 1539   LEUKOCYTESUR NEGATIVE 12/27/2017 1539    Radiological Exams on Admission: DG Chest 1 View  Result Date: 07/06/2019 CLINICAL DATA:  Altered mental status  EXAM: CHEST  1 VIEW COMPARISON:  12/27/2017 chest radiograph. FINDINGS: Left rotated chest radiograph. Stable cardiomediastinal silhouette with normal heart size. No pneumothorax. Small bilateral pleural effusions. No overt pulmonary edema. Hazy bibasilar lung opacities. IMPRESSION: 1. Small bilateral pleural effusions. 2. Hazy bibasilar lung opacities, favor atelectasis, difficult to exclude a component of aspiration or pneumonia. Electronically Signed   By: Delbert Phenix M.D.   On: 07/06/2019 15:07   CT Head Wo Contrast  Result Date: 07/06/2019 CLINICAL DATA:  Unwitnessed fall EXAM: CT HEAD WITHOUT CONTRAST TECHNIQUE: Contiguous axial images were obtained from the base of the skull through the vertex without intravenous contrast. COMPARISON:  12/27/2017 FINDINGS: Brain: There is no acute intracranial hemorrhage, mass-effect, or edema. There is no extra-axial fluid collection. Interval right larger than left chronic infarctions right occipital lobe and left parietooccipital lobes with associated volume loss. Additional new small chronic infarct of the left cerebellum. Confluent areas of hypoattenuation in the supratentorial white matter are nonspecific but probably reflect moderate to advanced chronic microvascular ischemic changes with progression since 2017. Prominence of the ventricles and sulci reflects generalized parenchymal volume loss, which has also increased since 2017. Vascular: There is atherosclerotic calcification at the skull base. Skull: Calvarium is unremarkable. Sinuses/Orbits: No acute finding. Chronic deformity of the right orbital floor. Other: Mastoid air cells are clear. IMPRESSION: No evidence of acute intracranial injury. New chronic infarcts and progression of chronic microvascular ischemic changes and parenchymal volume loss as detailed above. Electronically Signed   By: Guadlupe Spanish M.D.   On: 07/06/2019 15:10    EKG: Independently reviewed. Atrial fibrillation with RVR, rate 163.   Rate was faster when compared to prior.  Assessment/Plan Principal Problem:   Fall Active Problems:   A-fib (HCC)   Seizure disorder (HCC)   Hyperlipidemia  Merced Brougham Crosby is a 86 y.o. female with medical history significant for dementia, atrial fibrillation not on anticoagulation, seizure disorder, hyperlipidemia, depression with anxiety who is admitted after an unwitnessed fall.   Unwitnessed fall: Resulting in small right scalp laceration without open wound, otherwise no significant injury.  CT head negative for acute intracranial abnormality.  Initial concern for pneumonia in ED, however suspicion is low at this time.  Will hold further antibiotics.  Urinalysis is still pending.  Appears dehydrated.  Per daughter-in-law, patient requires full assist with all activities. -Give additional IV fluid hydration -Continue fall precautions -PT/OT eval -Follow-up urinalysis, treat as indicated -Anticipate discharge back to prior facility in 1-2 days  Atrial fibrillation:  Not on anticoagulation due to fall risk.  Presented to the ED A. fib with RVR and was given IV amiodarone with improved rate control. -Continue amiodarone 100 mg daily and Toprol XL 12.5 mg daily  Seizure disorder: Presents as focal seizures per daughter-in-law. -Continue home Depakote, Keppra, and zonisamide  Thrombocytopenia: Mild without obvious bleeding.  Continue monitor.  Dementia with cognitive/functional decline: Chronic and appears stable.  Delirium precautions.  DVT prophylaxis: SCDs Code Status: DNR, confirmed with daughter Family Communication: Discussed with daughter-in-law Kessie Croston by phone [phone] Disposition Plan: Likely discharge to prior facility after adequate hydration Consults called: None Admission status: Observation   Darreld Mclean MD [facility]  If 7PM-7AM, please contact night-coverage [email or web address]  07/06/2019, 10:33 PM

## 2019-07-06 NOTE — Progress Notes (Signed)
PHARMACY -  BRIEF ANTIBIOTIC NOTE   Pharmacy has received consult(s) for vanc and cefepime from an ED provider.  The patient's profile has been reviewed for ht/wt/allergies/indication/available labs.    One time order(s) placed for vanc 1 g + cefepime 2 g  Further antibiotics/pharmacy consults should be ordered by admitting physician if indicated.                       Thank you,  Pricilla Riffle, PharmD 07/06/2019  8:10 PM

## 2019-07-07 ENCOUNTER — Encounter: Payer: Self-pay | Admitting: Internal Medicine

## 2019-07-07 DIAGNOSIS — G40909 Epilepsy, unspecified, not intractable, without status epilepticus: Secondary | ICD-10-CM

## 2019-07-07 DIAGNOSIS — I482 Chronic atrial fibrillation, unspecified: Secondary | ICD-10-CM | POA: Diagnosis not present

## 2019-07-07 DIAGNOSIS — W19XXXD Unspecified fall, subsequent encounter: Secondary | ICD-10-CM | POA: Diagnosis not present

## 2019-07-07 DIAGNOSIS — E43 Unspecified severe protein-calorie malnutrition: Secondary | ICD-10-CM

## 2019-07-07 DIAGNOSIS — J690 Pneumonitis due to inhalation of food and vomit: Secondary | ICD-10-CM | POA: Diagnosis not present

## 2019-07-07 DIAGNOSIS — E785 Hyperlipidemia, unspecified: Secondary | ICD-10-CM

## 2019-07-07 DIAGNOSIS — R627 Adult failure to thrive: Secondary | ICD-10-CM

## 2019-07-07 LAB — BASIC METABOLIC PANEL
Anion gap: 12 (ref 5–15)
BUN: 46 mg/dL — ABNORMAL HIGH (ref 8–23)
CO2: 23 mmol/L (ref 22–32)
Calcium: 9.5 mg/dL (ref 8.9–10.3)
Chloride: 109 mmol/L (ref 98–111)
Creatinine, Ser: 0.99 mg/dL (ref 0.44–1.00)
GFR calc Af Amer: 59 mL/min — ABNORMAL LOW (ref >60)
GFR calc non Af Amer: 51 mL/min — ABNORMAL LOW (ref >60)
Glucose, Bld: 111 mg/dL — ABNORMAL HIGH (ref 70–99)
Potassium: 3.5 mmol/L (ref 3.5–5.1)
Sodium: 144 mmol/L (ref 135–145)

## 2019-07-07 LAB — CBC
HCT: 41.7 % (ref 36.0–46.0)
Hemoglobin: 13.3 g/dL (ref 12.0–15.0)
MCH: 30 pg (ref 26.0–34.0)
MCHC: 31.9 g/dL (ref 30.0–36.0)
MCV: 94.1 fL (ref 80.0–100.0)
Platelets: 78 10*3/uL — ABNORMAL LOW (ref 150–400)
RBC: 4.43 MIL/uL (ref 3.87–5.11)
RDW: 13.8 % (ref 11.5–15.5)
WBC: 8.9 10*3/uL (ref 4.0–10.5)
nRBC: 0 % (ref 0.0–0.2)

## 2019-07-07 LAB — URINALYSIS, COMPLETE (UACMP) WITH MICROSCOPIC
Bacteria, UA: NONE SEEN
Bilirubin Urine: NEGATIVE
Glucose, UA: NEGATIVE mg/dL
Ketones, ur: 5 mg/dL — AB
Nitrite: NEGATIVE
Protein, ur: 100 mg/dL — AB
Specific Gravity, Urine: 1.027 (ref 1.005–1.030)
WBC, UA: >50 WBC/hpf — ABNORMAL HIGH (ref 0–5)
pH: 6 (ref 5.0–8.0)

## 2019-07-07 LAB — TSH: TSH: 1.644 u[IU]/mL (ref 0.350–4.500)

## 2019-07-07 LAB — PROCALCITONIN: Procalcitonin: 7.05 ng/mL

## 2019-07-07 LAB — VITAMIN D 25 HYDROXY (VIT D DEFICIENCY, FRACTURES): Vit D, 25-Hydroxy: 9.83 ng/mL — ABNORMAL LOW (ref 30–100)

## 2019-07-07 LAB — MAGNESIUM: Magnesium: 2.3 mg/dL (ref 1.7–2.4)

## 2019-07-07 MED ORDER — METOPROLOL TARTRATE 5 MG/5ML IV SOLN
5.0000 mg | Freq: Once | INTRAVENOUS | Status: AC
  Administered 2019-07-07: 04:00:00 5 mg via INTRAVENOUS
  Filled 2019-07-07: qty 5

## 2019-07-07 MED ORDER — LEVALBUTEROL HCL 1.25 MG/0.5ML IN NEBU
1.2500 mg | INHALATION_SOLUTION | Freq: Three times a day (TID) | RESPIRATORY_TRACT | Status: DC
  Filled 2019-07-07 (×3): qty 0.5

## 2019-07-07 MED ORDER — ENSURE ENLIVE PO LIQD
237.0000 mL | ORAL | Status: DC
  Administered 2019-07-07: 18:00:00 237 mL via ORAL

## 2019-07-07 MED ORDER — IPRATROPIUM BROMIDE 0.02 % IN SOLN
0.5000 mg | Freq: Three times a day (TID) | RESPIRATORY_TRACT | Status: DC
  Filled 2019-07-07 (×3): qty 2.5

## 2019-07-07 MED ORDER — SENNOSIDES-DOCUSATE SODIUM 8.6-50 MG PO TABS
2.0000 | ORAL_TABLET | Freq: Every evening | ORAL | Status: DC | PRN
  Filled 2019-07-07: qty 2

## 2019-07-07 MED ORDER — POTASSIUM CHLORIDE 20 MEQ/15ML (10%) PO SOLN
20.0000 meq | Freq: Once | ORAL | Status: DC
  Filled 2019-07-07: qty 15

## 2019-07-07 MED ORDER — METOPROLOL TARTRATE 5 MG/5ML IV SOLN: 5.0000 mg | Freq: Once | INTRAVENOUS | Status: AC

## 2019-07-07 MED ORDER — POLYETHYLENE GLYCOL 3350 17 G PO PACK
17.0000 g | PACK | Freq: Every day | ORAL | Status: DC | PRN
  Filled 2019-07-07: qty 1

## 2019-07-07 MED ORDER — AMOXICILLIN-POT CLAVULANATE 875-125 MG PO TABS
1.0000 | ORAL_TABLET | Freq: Two times a day (BID) | ORAL | Status: DC
  Filled 2019-07-07 (×2): qty 1

## 2019-07-07 MED ORDER — METOPROLOL TARTRATE 5 MG/5ML IV SOLN
5.0000 mg | Freq: Once | INTRAVENOUS | Status: AC
  Administered 2019-07-07: 03:00:00 5 mg via INTRAVENOUS
  Filled 2019-07-07: qty 5

## 2019-07-07 MED ORDER — POTASSIUM CHLORIDE CRYS ER 20 MEQ PO TBCR
20.0000 meq | EXTENDED_RELEASE_TABLET | Freq: Once | ORAL | Status: DC
  Administered 2019-07-07: 06:00:00 20 meq via ORAL

## 2019-07-07 MED ORDER — AMOXICILLIN-POT CLAVULANATE 400-57 MG/5ML PO SUSR
875.0000 mg | Freq: Two times a day (BID) | ORAL | Status: DC
  Administered 2019-07-07 – 2019-07-08 (×2): 875 mg via ORAL
  Filled 2019-07-07: qty 10.9
  Filled 2019-07-07: qty 10.94
  Filled 2019-07-07 (×2): qty 10.9

## 2019-07-07 MED ORDER — METOPROLOL TARTRATE 5 MG/5ML IV SOLN
INTRAVENOUS | Status: AC
  Administered 2019-07-07: 03:00:00 5 mg via INTRAVENOUS
  Filled 2019-07-07: qty 5

## 2019-07-07 NOTE — ED Notes (Signed)
Pt continues resting at this time. Pt in NSR.

## 2019-07-07 NOTE — ED Notes (Signed)
This RN attempted to give patient breakfast. Pt refused all the foods she was offered. Pt began raising her voice stating "what kind of place is this?" and when nurse asked her to repeat herself pt said "can you not hear?" pt then started saying things about [facility] and this RN states pt was at [facility] in [place]. Pt offered oatmeal, grits, eggs, bacon, and other foods but pt states "it would be better smeared on the floor." Pt food sat aside. Pt declined apple juice as well. Pt left alone to go back to sleep.

## 2019-07-07 NOTE — ED Notes (Signed)
Meds were crushed in chocolate ice cream, which patient refused to take. Will attempt again later with vanilla, which patient seems to prefer. Patient continues to be verbally abusive, but easy to redirect.

## 2019-07-07 NOTE — Progress Notes (Signed)
Patient is currently followed by [employer] community Palliative program at [place] [facility] CMRN Dagoberto Reef made aware. Dayna Barker BSN, RN, [facility] Liaison [facility]

## 2019-07-07 NOTE — ED Notes (Signed)
Patient tolerated sips of plain water drinking from cup without straw. Care transferred to receiving RN.

## 2019-07-07 NOTE — Progress Notes (Signed)
Pt to 151  From ed via str. Alert to self only/ unable to answer  Questions. For admission.  Will try to call pts dtr in law who has been corosponding with md. Pt has dementia.  Denies pain when asked. Pt will hit out and push you away.

## 2019-07-07 NOTE — ED Notes (Signed)
Pt noted to be back in a NSR Cliffton Asters NP made aware.

## 2019-07-07 NOTE — Progress Notes (Signed)
Nurse reports patient with redevelopment of a fib with rvr. BP stable. Mental status at baseline. Converted after 3 doses of 5 mg metoprolol IV to total dose of 15 mg.

## 2019-07-07 NOTE — Progress Notes (Signed)
Admission navigator done with pt's daughter in law on the phone  Pt has focal seizures. Blank stare or clap her hands or shuffle things around, hallucinates during the seizure and cant distinguish reality  Has had these since a childhood illness with high fevers.   Has these daily.  May be difficult to detect.

## 2019-07-07 NOTE — ED Notes (Signed)
Pharmacy has been messaged regarding missing med

## 2019-07-07 NOTE — ED Notes (Signed)
Pt noted to be in AFib with RVR, B. Jon Billings made aware.

## 2019-07-07 NOTE — Evaluation (Addendum)
Clinical/Bedside Swallow Evaluation Patient Details  Name: Sherry Crosby MRN: [id number] Date of Birth: Dec 07, 1928  Today's Date: 07/07/2019 Time: SLP Start Time (ACUTE ONLY): 1100 SLP Stop Time (ACUTE ONLY): 1150 SLP Time Calculation (min) (ACUTE ONLY): 50 min  Past Medical History:  Past Medical History:  Diagnosis Date  . A-fib (HCC)   . Anxiety   . Depression   . High cholesterol   . Hx of degenerative disc disease   . Recurrent UTI   . Seizures (HCC)    Past Surgical History:  Past Surgical History:  Procedure Laterality Date  . cataract surgery    . Tonsillectomy     HPI:  Pt is a 86 y.o. female with medical history significant for advanced Dementia, atrial fibrillation not on anticoagulation, seizure disorder, hyperlipidemia, depression with anxiety, recurrent UTIs, and Reduced Oral Intake per Palliative Care notes at the Facility who presents to the ED from her Facility for evaluation after an unwitnessed fall.  Unable to obtain history from patient due to her Dementia.  Patient currently lives at [facility] facility and per daughter-in-law is completely nonambulatory.  She requires full assist with ADLs and IADLs.  She had reported unwitnessed fall at her facility.  She was noted to have a laceration at the right side of her scalp.  She was brought to the ED via EMS for further evaluation.  On EMS evaluation she was noted to be in atrial fibrillation with rapid ventricular rate at 145; this has resolved per NSG report.  Pt is Edentulous at baseline.  Pt is often Easily Agitated and requires redirection(successful most times).     Assessment / Plan / Recommendation Clinical Impression  Pt appears to present w/ grossly adequate oropharyngeal phase bolus management of modified food consistency(puree) and swallowing during brief assessment w/ only few po trials (that pt would accept). Pt has a Baseline of Edentulous status, Advanced Dementia, and reduced oral intake. CXR:  "Small bilateral pleural effusions; Hazy bibasilar lung opacities, favor atelectasis". Pt is followed by Palliative Care services at her Facility per chart notes. OM exam appeared [facility] w/ adequate lingual strength/ROM during bolus management and oral clearing. Speech clear, min low volume.  Pt followed instruction intermittently participating in the evaluation when offered ice cream; she only accepted a few trials of po's after that.  During this evaluation, pt consumed trials of thin liquids via cup/straw -- no overt coughing noted via Cup w/ NSG; mild cough noted when drinking via straw. No change in vocal quality noted upon verbalizing post/between trials. No decline in O2 sats(99%) or respiratory status noted post the thin liquid trials. During trials of increased texture presented, pt accpeted 1 trial of Applesauce but then only accepted trials of ice cream(technically a liquid) but demonstrated adequate oral phase management of the boluses as she mashed/gummed and manipulated the boluses preparing for A-P transfer/swallow. Oral clearing noted post trials. Pt declined any trials of solids.  Pt helped to feed self given setup assist -- often Not accepting any help/support becoming min agitated.  Recommend a Puree diet consistency w/ soups d/t Edentulous status(baseline), thin liquids VIA CUP at this time. Recommend aspiration precautions to include Small, Single Sips and not using straws. Recommend Pills given in Puree Crushed for safer swallowing. Recommend reducing distractions during meals to help calm pt; tray setup at meals. NSG to continue to monitor pt's status and will reconsult ST services if any decline in status while admitted. Precautions posted at bedside. NSG updated. SLP  Visit Diagnosis: Dysphagia, oropharyngeal phase (R13.12)(appears at her suspected Baseline)    Aspiration Risk  Mild aspiration risk;Risk for inadequate nutrition/hydration(reduced following precautions)    Diet  Recommendation  Pureed diet w/ Thin liquids VIA CUP; general aspiration precautions including reducing distractions and tray setup to support pt. Dietician f/u w/ milkshakes, ice cream -- foods of pleasure.  Medication Administration: Crushed with puree(for safer swallowing d/t Dementia)    Other  Recommendations Recommended Consults: (Dietician f/u; Palliative Care services following) Oral Care Recommendations: Oral care BID;Oral care before and after PO;Staff/trained caregiver to provide oral care Other Recommendations: (n/a)   Follow up Recommendations None      Frequency and Duration (n/a)  (n/a)       Prognosis Prognosis for Safe Diet Advancement: Fair Barriers to Reach Goals: Cognitive deficits;Time post onset;Severity of deficits      Swallow Study   General Date of Onset: 07/06/19 HPI: Pt is a 86 y.o. female with medical history significant for advanced Dementia, atrial fibrillation not on anticoagulation, seizure disorder, hyperlipidemia, depression with anxiety, recurrent UTIs, and Reduced Oral Intake per Palliative Care notes at the Facility who presents to the ED from her Facility for evaluation after an unwitnessed fall.  Unable to obtain history from patient due to her Dementia.  Patient currently lives at [facility] and per daughter-in-law is completely nonambulatory.  She requires full assist with ADLs and IADLs.  She had reported unwitnessed fall at her facility.  She was noted to have a laceration at the right side of her scalp.  She was brought to the ED via EMS for further evaluation.  On EMS evaluation she was noted to be in atrial fibrillation with rapid ventricular rate at 145; this has resolved per NSG report.  Pt is Edentulous at baseline.  Pt is often Easily Agitated and requires redirection(successful most times).   Type of Study: Bedside Swallow Evaluation Previous Swallow Assessment: none Diet Prior to this Study: Regular;Thin liquids Temperature  Spikes Noted: No(wbc 8.9) Respiratory Status: Room air History of Recent Intubation: No Behavior/Cognition: Alert;Cooperative;Pleasant mood;Confused;Agitated;Distractible;Requires cueing;Uncooperative(Needed Redirection) Oral Cavity Assessment: Within Functional Limits(w/ brief exam) Oral Care Completed by SLP: Recent completion by staff Oral Cavity - Dentition: Edentulous Vision: Functional for self-feeding Self-Feeding Abilities: Able to feed self;Needs set up Patient Positioning: Upright in bed(needed positioning) Baseline Vocal Quality: Normal;Low vocal intensity Volitional Cough: Cognitively unable to elicit Volitional Swallow: Unable to elicit    Oral/Motor/Sensory Function Overall Oral Motor/Sensory Function: Within functional limits(appeared so w/ bolus management and clearing)   Ice Chips Ice chips: Not tested   Thin Liquid Thin Liquid: Within functional limits(grossly) Presentation: Cup;Self Fed;Straw(2 sips, then multiple sips via Straw; later w/ NSG via Cup) Other Comments: no overt coughing noted via Cup w/ NSG; mild cough noted when drinking via straw    Nectar Thick Nectar Thick Liquid: Not tested   Honey Thick Honey Thick Liquid: Not tested   Puree Puree: Within functional limits(but most trials were w/ ice cream; 10+) Presentation: Self Fed;Spoon Other Comments: pt declined applesauce post 1 trial   Solid     Solid: Not tested Other Comments: Edentulous        Orinda Kenner, MS, CCC-SLP , 07/07/2019,1:42 PM

## 2019-07-07 NOTE — Progress Notes (Signed)
Initial Nutrition Assessment  DOCUMENTATION CODES:   Underweight  INTERVENTION:  Ensure Enlive po daily VIA CUP, each supplement provides 350 kcal and 20 grams of protein  Magic cup BID with lunch and dinner meals, each supplement provides 290 kcal and 9 grams of protein  Recommend obtaining new weight   NUTRITION DIAGNOSIS:   Increased nutrient needs related to acute illness as evidenced by estimated needs.  GOAL:   Patient will meet greater than or equal to 90% of their needs   MONITOR:   Supplement acceptance, Weight trends, PO intake, Labs, Skin, I & O's  REASON FOR ASSESSMENT:   Consult Assessment of nutrition requirement/status  ASSESSMENT:  RD working remotely.  86 year old female with past medical history significant for dementia, atrial fibrillation, seizure disorder, HLD, depression with anxiety who presents to ED from facility for evaluation of unwitnessed fall resulting in small right scalp laceration without open wound. CT head negative for acute intracranial injury, new chronic infarct and progression of chronic microvascular ischemic changes and parenchymal volume losses noted, CXR showed small bilateral pleural effusions and hazy bibasilar lung opacities  Unable to obtain nutrition history at this time, patient currently in ED. Per notes, pt awake but not oriented or able to provide relevant history. Per chart review, pt is non-ambulatory, dependent for ADLs at baseline and is followed by palliative care at LTC.  Diet has been downgraded to dysphagia 1 s/p SLP evaluation. No documented meals for review at this time. RD will provide Magic Cup with lunch and dinner meals as well as daily Ensure to aid with calorie/protein needs.   Current wt 45.4 kg (99.88 lbs) No recent wt history available for review. Suspect current wt based on last recorded wt, pt 45.4 kg in July 2019 given pt is non-ambulatory. Recommend obtaining new wt to fully assess needs and weight  status.   Medications and labs reviewed  NUTRITION - FOCUSED PHYSICAL EXAM: Unable to complete at this time, RD working remotely.   Diet Order:   Diet Order            DIET - DYS 1 Room service appropriate? Yes with Assist; Fluid consistency: Thin  Diet effective now              EDUCATION NEEDS:   No education needs have been identified at this time  Skin:  Skin Assessment: Reviewed RN Assessment  Last BM:  Unknown  Height:   Ht Readings from Last 1 Encounters:  07/06/19 5\' 5"  (1.651 m)    Weight:   Wt Readings from Last 1 Encounters:  07/06/19 45.4 kg    Ideal Body Weight:  56.8 kg  BMI:  Body mass index is 16.64 kg/m.  Estimated Nutritional Needs:   Kcal:  1200-1400  Protein:  60-70  Fluid:  >/= 1.1 L/day   09/03/19, RD, LDN Clinical Nutrition [place] Telephone [phone] After Hours/Weekend Pager: [phone]

## 2019-07-07 NOTE — ED Notes (Signed)
Pt resting quietly at this time. NAD. NSR at this time.

## 2019-07-07 NOTE — ED Notes (Signed)
Patient's diaper was changed and new chux placed under patient. Patient was repositioned on stretcher. Patient became agitated and hit out at staff.

## 2019-07-07 NOTE — Progress Notes (Signed)
Also informed by family that patient has a device inserted vaginally that hold up her prolapsed uterus.  This should be cleaned yearly, she is unsure if this has been done at the facility.

## 2019-07-07 NOTE — ED Notes (Signed)
Pt resting comfortably with eyes closed

## 2019-07-07 NOTE — ED Notes (Signed)
Cliffton Asters NP in to eval

## 2019-07-07 NOTE — Progress Notes (Signed)
OT Cancellation Note  Patient Details Name: DYMOND GUTT MRN: [id number] DOB: 06/26/1929   Cancelled Treatment:    Reason Eval/Treat Not Completed: OT screened, no needs identified, will sign off;Other (comment). Thank you for the OT consult. Order received and chart reviewed. Pt noted to be non-ambulatory, dependent for ADLs at baseline with advanced dementia. Pt followed by palliative care and living in LTC prior to presentation to the hospital. Pt does not appear to have acute need for skilled OT services at this time. Will sign off (primary attending aware).   Rockney Ghee, M.S., OTR/L Ascom: [phone] 07/07/19, 9:47 AM

## 2019-07-07 NOTE — Progress Notes (Signed)
PROGRESS NOTE    Sherry Crosby  ZOX:[id number] DOB: 05-25-1929 DOA: 07/06/2019 PCP: Marisa Hua, MD   Brief Narrative:  86 year old with history of dementia, A. fib not on anticoagulation, seizure, HLD, depression with anxiety presented to the hospital with unwitnessed fall causing a small right-sided scalp laceration.  Rest of the work-up negative.  Per family patient requires full assist with all the activities.   Assessment & Plan:   Principal Problem:   Fall Active Problems:   A-fib ([place])   Seizure disorder ([place])   Hyperlipidemia   Failure to thrive in adult   Severe protein-calorie malnutrition ([place])  Unwitnessed fall suspect moderate to severe dehydration Failure to thrive, adult Severe protein calorie malnutrition -Supportive care, IV fluids. -Check TSH, vitamin D -CT head-negative, UA shows sterile pyuria -Gentle hydration  Pleural infiltrate/atelectasis Pneumonia-HCAP versus aspiration -Start bronchodilators, supplemental oxygen, incentive spirometer -Empiric Augmentin.  Procalcitonin elevated -Speech and swallow eval  History of paroxysmal atrial fibrillation -Continue amiodarone and metoprolol.  Not on anticoagulation due to falls risk  History of dementia -On Depakote  DVT prophylaxis: SCDs Code Status: Is DNR Family Communication: None Disposition Plan: Maintain hospital stay for complete work-up, likely return back to her facility tomorrow  Consultants:   None  Procedures:   None  Antimicrobials:   Augmentin day 1   Subjective: Patient does not want to participate much in conversation this morning.  Keeps turning her head away.  Review of Systems Otherwise negative except as per HPI, including: Difficult to assess given her mentation  Objective: Vitals:   07/07/19 0600 07/07/19 0730 07/07/19 0745 07/07/19 0800  BP: 128/63 (!) 115/49  (!) 108/56  Pulse:  (!) 58 (!) 58 (!) 59  Resp: 17 15 (!) 21 (!) 22  Temp:      TempSrc:       SpO2:  99% 98% 98%  Weight:      Height:        Intake/Output Summary (Last 24 hours) at 07/07/2019 0840 Last data filed at 07/07/2019 0043 Gross per 24 hour  Intake 500 ml  Output --  Net 500 ml   Filed Weights   07/06/19 1419  Weight: 45.4 kg    Examination:  General exam: Elderly frail, laying in the bed, bilateral temporal wasting and cachectic Respiratory system: Diminished breath sounds at the bases but poor respiratory efforts Cardiovascular system: S1 & S2 heard, RRR. No JVD, murmurs, rubs, gallops or clicks. No pedal edema. Gastrointestinal system: Abdomen is nondistended, soft and nontender. No organomegaly or masses felt. Normal bowel sounds heard. Central nervous system: Difficulty to assess but grossly moving all the extremities Extremities: No gross deformities noted Skin: No rashes, lesions or ulcers Psychiatry: Difficult to assess    Data Reviewed:   CBC: Recent Labs  Lab 07/06/19 1508 07/07/19 0444  WBC 8.8 8.9  NEUTROABS 7.8*  --   HGB 14.4 13.3  HCT 46.0 41.7  MCV 95.4 94.1  PLT 83* 78*   Basic Metabolic Panel: Recent Labs  Lab 07/06/19 1508 07/07/19 0244 07/07/19 0444  NA 142 144  --   K 3.6 3.5  --   CL 107 109  --   CO2 22 23  --   GLUCOSE 156* 111*  --   BUN 44* 46*  --   CREATININE 1.19* 0.99  --   CALCIUM 9.7 9.5  --   MG  --   --  2.3   GFR: Estimated Creatinine Clearance: 28.2 mL/min (by C-G  formula based on SCr of 0.99 mg/dL). Liver Function Tests: Recent Labs  Lab 07/06/19 1508  AST 14*  ALT 10  ALKPHOS 55  BILITOT 0.8  PROT 6.4*  ALBUMIN 2.9*   No results for input(s): LIPASE, AMYLASE in the last 168 hours. No results for input(s): AMMONIA in the last 168 hours. Coagulation Profile: No results for input(s): INR, PROTIME in the last 168 hours. Cardiac Enzymes: No results for input(s): CKTOTAL, CKMB, CKMBINDEX, TROPONINI in the last 168 hours. BNP (last 3 results) No results for input(s): PROBNP in the last  8760 hours. HbA1C: No results for input(s): HGBA1C in the last 72 hours. CBG: No results for input(s): GLUCAP in the last 168 hours. Lipid Profile: No results for input(s): CHOL, HDL, LDLCALC, TRIG, CHOLHDL, LDLDIRECT in the last 72 hours. Thyroid Function Tests: No results for input(s): TSH, T4TOTAL, FREET4, T3FREE, THYROIDAB in the last 72 hours. Anemia Panel: No results for input(s): VITAMINB12, FOLATE, FERRITIN, TIBC, IRON, RETICCTPCT in the last 72 hours. Sepsis Labs: Recent Labs  Lab 07/06/19 1829 07/06/19 2011 07/06/19 2231 07/07/19 0444  PROCALCITON 10.25  --   --  7.05  LATICACIDVEN  --  2.1* 2.7*  --     Recent Results (from the past 240 hour(s))  Respiratory Panel by RT PCR (Flu A&B, Covid) - Nasopharyngeal Swab     Status: None   Collection Time: 07/06/19  8:11 PM   Specimen: Nasopharyngeal Swab  Result Value Ref Range Status   SARS Coronavirus 2 by RT PCR NEGATIVE NEGATIVE Final    Comment: (NOTE) SARS-CoV-2 target nucleic acids are NOT DETECTED. The SARS-CoV-2 RNA is generally detectable in upper respiratoy specimens during the acute phase of infection. The lowest concentration of SARS-CoV-2 viral copies this assay can detect is 131 copies/mL. A negative result does not preclude SARS-Cov-2 infection and should not be used as the sole basis for treatment or other patient management decisions. A negative result may occur with  improper specimen collection/handling, submission of specimen other than nasopharyngeal swab, presence of viral mutation(s) within the areas targeted by this assay, and inadequate number of viral copies (<131 copies/mL). A negative result must be combined with clinical observations, patient history, and epidemiological information. The expected result is Negative. Fact Sheet for Patients:  https://www.moore.com/ Fact Sheet for Healthcare Providers:  https://www.young.biz/ This test is not yet ap  proved or cleared by the [place] FDA and  has been authorized for detection and/or diagnosis of SARS-CoV-2 by FDA under an Emergency Use Authorization (EUA). This EUA will remain  in effect (meaning this test can be used) for the duration of the COVID-19 declaration under Section 564(b)(1) of the Act, 21 U.S.C. section 360bbb-3(b)(1), unless the authorization is terminated or revoked sooner.    Influenza A by PCR NEGATIVE NEGATIVE Final   Influenza B by PCR NEGATIVE NEGATIVE Final    Comment: (NOTE) The Xpert Xpress SARS-CoV-2/FLU/RSV assay is intended as an aid in  the diagnosis of influenza from Nasopharyngeal swab specimens and  should not be used as a sole basis for treatment. Nasal washings and  aspirates are unacceptable for Xpert Xpress SARS-CoV-2/FLU/RSV  testing. Fact Sheet for Patients: https://www.moore.com/ Fact Sheet for Healthcare Providers: https://www.young.biz/ This test is not yet approved or cleared by the [place] FDA and  has been authorized for detection and/or diagnosis of SARS-CoV-2 by  FDA under an Emergency Use Authorization (EUA). This EUA will remain  in effect (meaning this test can be used) for the duration  of the  Covid-19 declaration under Section 564(b)(1) of the Act, 21  U.S.C. section 360bbb-3(b)(1), unless the authorization is  terminated or revoked. Performed at [facility], [address] [place], [place] [address]   Blood culture (routine x 2)     Status: None (Preliminary result)   Collection Time: 07/06/19  8:11 PM   Specimen: BLOOD  Result Value Ref Range Status   Specimen Description BLOOD BLOOD RIGHT ARM  Final   Special Requests   Final    BOTTLES DRAWN AEROBIC AND ANAEROBIC Blood Culture adequate volume   Culture   Final    NO GROWTH < 12 HOURS Performed at [facility], [address] [place], [place] [address]    Report Status PENDING  Incomplete  Blood  culture (routine x 2)     Status: None (Preliminary result)   Collection Time: 07/06/19  8:11 PM   Specimen: BLOOD  Result Value Ref Range Status   Specimen Description BLOOD LEFT ANTECUBITAL  Final   Special Requests   Final    BOTTLES DRAWN AEROBIC AND ANAEROBIC Blood Culture adequate volume   Culture   Final    NO GROWTH < 12 HOURS Performed at [facility], [address], [place], [place] [address]    Report Status PENDING  Incomplete         Radiology Studies: DG Chest 1 View  Result Date: 07/06/2019 CLINICAL DATA:  Altered mental status EXAM: CHEST  1 VIEW COMPARISON:  12/27/2017 chest radiograph. FINDINGS: Left rotated chest radiograph. Stable cardiomediastinal silhouette with normal heart size. No pneumothorax. Small bilateral pleural effusions. No overt pulmonary edema. Hazy bibasilar lung opacities. IMPRESSION: 1. Small bilateral pleural effusions. 2. Hazy bibasilar lung opacities, favor atelectasis, difficult to exclude a component of aspiration or pneumonia. Electronically Signed   By: Delbert Phenix M.D.   On: 07/06/2019 15:07   CT Head Wo Contrast  Result Date: 07/06/2019 CLINICAL DATA:  Unwitnessed fall EXAM: CT HEAD WITHOUT CONTRAST TECHNIQUE: Contiguous axial images were obtained from the base of the skull through the vertex without intravenous contrast. COMPARISON:  12/27/2017 FINDINGS: Brain: There is no acute intracranial hemorrhage, mass-effect, or edema. There is no extra-axial fluid collection. Interval right larger than left chronic infarctions right occipital lobe and left parietooccipital lobes with associated volume loss. Additional new small chronic infarct of the left cerebellum. Confluent areas of hypoattenuation in the supratentorial white matter are nonspecific but probably reflect moderate to advanced chronic microvascular ischemic changes with progression since 2017. Prominence of the ventricles and sulci reflects generalized parenchymal volume loss,  which has also increased since 2017. Vascular: There is atherosclerotic calcification at the skull base. Skull: Calvarium is unremarkable. Sinuses/Orbits: No acute finding. Chronic deformity of the right orbital floor. Other: Mastoid air cells are clear. IMPRESSION: No evidence of acute intracranial injury. New chronic infarcts and progression of chronic microvascular ischemic changes and parenchymal volume loss as detailed above. Electronically Signed   By: Guadlupe Spanish M.D.   On: 07/06/2019 15:10        Scheduled Meds: . amiodarone  100 mg Oral Daily  . amoxicillin-clavulanate  1 tablet Oral Q12H  . divalproex  125 mg Oral TID  . levETIRAcetam  1,000 mg Oral BID  . metoprolol succinate  12.5 mg Oral Daily  . potassium chloride  20 mEq Oral Once  . sodium chloride flush  3 mL Intravenous Q12H  . zonisamide  100 mg Oral Daily   Continuous Infusions:  LOS: 0 days   Time spent=35 mins    Berneice Zettlemoyer Joline Maxcy, MD [facility]  If 7PM-7AM, please contact night-coverage  07/07/2019, 8:40 AM

## 2019-07-08 DIAGNOSIS — R627 Adult failure to thrive: Secondary | ICD-10-CM | POA: Diagnosis not present

## 2019-07-08 DIAGNOSIS — W19XXXD Unspecified fall, subsequent encounter: Secondary | ICD-10-CM | POA: Diagnosis not present

## 2019-07-08 DIAGNOSIS — J690 Pneumonitis due to inhalation of food and vomit: Secondary | ICD-10-CM | POA: Diagnosis not present

## 2019-07-08 DIAGNOSIS — E785 Hyperlipidemia, unspecified: Secondary | ICD-10-CM | POA: Diagnosis not present

## 2019-07-08 LAB — COMPREHENSIVE METABOLIC PANEL
ALT: 9 U/L (ref 0–44)
AST: 10 U/L — ABNORMAL LOW (ref 15–41)
Albumin: 2.2 g/dL — ABNORMAL LOW (ref 3.5–5.0)
Alkaline Phosphatase: 42 U/L (ref 38–126)
Anion gap: 5 (ref 5–15)
BUN: 37 mg/dL — ABNORMAL HIGH (ref 8–23)
CO2: 25 mmol/L (ref 22–32)
Calcium: 9.2 mg/dL (ref 8.9–10.3)
Chloride: 111 mmol/L (ref 98–111)
Creatinine, Ser: 0.91 mg/dL (ref 0.44–1.00)
GFR calc Af Amer: >60 mL/min (ref >60)
GFR calc non Af Amer: 56 mL/min — ABNORMAL LOW (ref >60)
Glucose, Bld: 147 mg/dL — ABNORMAL HIGH (ref 70–99)
Potassium: 3.4 mmol/L — ABNORMAL LOW (ref 3.5–5.1)
Sodium: 141 mmol/L (ref 135–145)
Total Bilirubin: 0.5 mg/dL (ref 0.3–1.2)
Total Protein: 4.9 g/dL — ABNORMAL LOW (ref 6.5–8.1)

## 2019-07-08 LAB — PROCALCITONIN: Procalcitonin: 3.81 ng/mL

## 2019-07-08 LAB — MAGNESIUM: Magnesium: 2.1 mg/dL (ref 1.7–2.4)

## 2019-07-08 MED ORDER — VITAMIN D 25 MCG (1000 UNIT) PO TABS
1000.0000 [IU] | ORAL_TABLET | Freq: Every day | ORAL | Status: DC
  Administered 2019-07-08: 09:00:00 1000 [IU] via ORAL
  Filled 2019-07-08: qty 1

## 2019-07-08 MED ORDER — METOPROLOL TARTRATE 5 MG/5ML IV SOLN
5.0000 mg | INTRAVENOUS | Status: AC | PRN
  Administered 2019-07-08 (×2): 5 mg via INTRAVENOUS
  Filled 2019-07-08 (×3): qty 5

## 2019-07-08 MED ORDER — VITAMIN D3 25 MCG PO TABS: 1000.0000 [IU] | ORAL_TABLET | Freq: Every day | ORAL | 0 refills | Status: AC

## 2019-07-08 MED ORDER — AMOXICILLIN-POT CLAVULANATE 400-57 MG/5ML PO SUSR: 875.0000 mg | Freq: Two times a day (BID) | ORAL | 0 refills | Status: AC

## 2019-07-08 MED ORDER — LEVALBUTEROL HCL 1.25 MG/0.5ML IN NEBU
1.2500 mg | INHALATION_SOLUTION | Freq: Three times a day (TID) | RESPIRATORY_TRACT | Status: DC
  Administered 2019-07-08: 08:00:00 1.25 mg via RESPIRATORY_TRACT

## 2019-07-08 MED ORDER — IPRATROPIUM BROMIDE 0.02 % IN SOLN
0.5000 mg | Freq: Three times a day (TID) | RESPIRATORY_TRACT | Status: DC
  Administered 2019-07-08: 08:00:00 0.5 mg via RESPIRATORY_TRACT

## 2019-07-08 MED ORDER — POTASSIUM CHLORIDE CRYS ER 20 MEQ PO TBCR
40.0000 meq | EXTENDED_RELEASE_TABLET | Freq: Once | ORAL | Status: AC
  Administered 2019-07-08: 09:00:00 40 meq via ORAL
  Filled 2019-07-08: qty 2

## 2019-07-08 NOTE — TOC Progression Note (Signed)
Transition of Care [facility]) - Progression Note    Patient Details  Name: EULETA BELSON MRN: [id number] Date of Birth: 1928/12/09  Transition of Care [facility]) CM/SW Contact  Staphany Ditton, [place], [place] Phone Number: 07/08/2019, 10:21 AM  Clinical Narrative:    Patient to discharge back to [facility]. Patient to be transported by EMS. Patient will be going to the [facility] [place] (special room for isolation Room B12-2. Report to be called in to [facility] [phone].    Verna Czech, [place] Clinical Social Worker   [phone]        Expected Discharge Plan and Services           Expected Discharge Date: 07/08/19                                     Social Determinants of Health (SDOH) Interventions    Readmission Risk Interventions No flowsheet data found.

## 2019-07-08 NOTE — Progress Notes (Signed)
Report given to [employer] at [facility]).  Patient prepared for transport.  EMS called, and awaiting truck for transport.  Will continue to monitor.

## 2019-07-08 NOTE — Discharge Summary (Signed)
Physician Discharge Summary  Sherry Crosby WGN:[id number] DOB: Aug 16, 1928 DOA: 07/06/2019  PCP: Drue FlirtHenry-Smith, Carol, MD  Admit date: 07/06/2019 Discharge date: 07/08/2019  Admitted From:LTC Disposition:  LTC  Recommendations for Outpatient Follow-up:  1. Follow up with PCP in 1-2 weeks 2. Please obtain BMP/CBC in one week your next doctors visit.  3. Augmentin BID for 5 days  4. Vit D Supp Added  Pureed diet w/ Thin liquids VIA CUP; general aspiration precautions including reducing distractions and tray setup to support pt. Dietician f/u w/ milkshakes, ice cream -- foods of pleasure.  Medication Administration: Crushed with puree(for safer swallowing d/t Dementia)    Discharge Condition: Stable CODE STATUS: DNR Diet recommendation: Cardiac  Brief/Interim Summary: 86 year old with history of dementia, A. fib not on anticoagulation, seizure, HLD, depression with anxiety presented to the hospital with unwitnessed fall causing a small right-sided scalp laceration.  Rest of the work-up negative.  Per family patient requires full assist with all the activities. Her TSH levels are relatively normal but showed vitamin D deficiency therefore started on supplements.  There were concerns of aspiration pneumonia therefore started on Augmentin and will be discharged on 5 more days.  She was seen by speech and swallow therapist in the hospital-recommended pured diet with thin liquids via cup, follow general aspiration precautions.  Spoke with her daughter Sherry Crosby on the day of discharge.   Unwitnessed fall suspect moderate to severe dehydration Failure to thrive, adult Severe protein calorie malnutrition -Supportive care, IV fluids. -TSH- WNL -CT head-negative, UA shows sterile pyuria -Gentle hydration  Vit D def- started Supp.  Pleural infiltrate/atelectasis Pneumonia-HCAP versus aspiration -Incentive spirometer -Complete 5 more days of oral Augmentin -Speech and swallow eval-recommendation  as stated above  History of paroxysmal atrial fibrillation -Continue amiodarone and metoprolol.  Not on anticoagulation due to falls risk  History of dementia -On Depakote  Discharge Diagnoses:  Principal Problem:   Fall Active Problems:   A-fib (HCC)   Seizure disorder (HCC)   Hyperlipidemia   Failure to thrive in adult   Severe protein-calorie malnutrition (HCC)   Aspiration pneumonia (HCC)    Consultations:  None  Subjective: Feels okay, no complaints  Discharge Exam: Vitals:   07/08/19 0725 07/08/19 0803  BP:  121/61  Pulse: 69 74  Resp:  17  Temp:  98 F (36.7 C)  SpO2:  96%   Vitals:   07/08/19 0644 07/08/19 0715 07/08/19 0725 07/08/19 0803  BP: 110/63   121/61  Pulse: (!) 123 (!) 144 69 74  Resp: 16   17  Temp: 98.2 F (36.8 C)   98 F (36.7 C)  TempSrc: Oral   Oral  SpO2: 97%   96%  Weight:      Height:        General: Elderly frail.  Bilateral temporal wasting Cardiovascular: RRR, S1/S2 +, no rubs, no gallops Respiratory: CTA bilaterally, no wheezing, no rhonchi Abdominal: Soft, NT, ND, bowel sounds + Extremities: no edema, no cyanosis  Discharge Instructions   Allergies as of 07/08/2019   No Known Allergies     Medication List    TAKE these medications   amiodarone 100 MG tablet Commonly known as: PACERONE Take 100 mg by mouth daily.   amoxicillin-clavulanate 400-57 MG/5ML suspension Commonly known as: AUGMENTIN Take 10.9 mLs (875 mg total) by mouth every 12 (twelve) hours for 5 days.   divalproex 125 MG DR tablet Commonly known as: DEPAKOTE Take 125 mg by mouth 3 (three) times daily.  levETIRAcetam 100 MG/ML solution Commonly known as: KEPPRA Take 1,000 mg by mouth 2 (two) times daily.   LORazepam 0.5 MG tablet Commonly known as: ATIVAN Take 0.5 mg by mouth 2 (two) times daily.   metoprolol succinate 25 MG 24 hr tablet Commonly known as: TOPROL-XL Take 0.5 tablets (12.5 mg total) by mouth daily.   Vitamin D3 25  MCG tablet Commonly known as: Vitamin D Take 1 tablet (1,000 Units total) by mouth daily.   zonisamide 100 MG capsule Commonly known as: ZONEGRAN Take 100 mg by mouth daily.       No Known Allergies  You were cared for by a hospitalist during your hospital stay. If you have any questions about your discharge medications or the care you received while you were in the hospital after you are discharged, you can call the unit and asked to speak with the hospitalist on call if the hospitalist that took care of you is not available. Once you are discharged, your primary care physician will handle any further medical issues. Please note that no refills for any discharge medications will be authorized once you are discharged, as it is imperative that you return to your primary care physician (or establish a relationship with a primary care physician if you do not have one) for your aftercare needs so that they can reassess your need for medications and monitor your lab values.   Procedures/Studies: DG Chest 1 View  Result Date: 07/06/2019 CLINICAL DATA:  Altered mental status EXAM: CHEST  1 VIEW COMPARISON:  12/27/2017 chest radiograph. FINDINGS: Left rotated chest radiograph. Stable cardiomediastinal silhouette with normal heart size. No pneumothorax. Small bilateral pleural effusions. No overt pulmonary edema. Hazy bibasilar lung opacities. IMPRESSION: 1. Small bilateral pleural effusions. 2. Hazy bibasilar lung opacities, favor atelectasis, difficult to exclude a component of aspiration or pneumonia. Electronically Signed   By: Ilona Sorrel M.D.   On: 07/06/2019 15:07   CT Head Wo Contrast  Result Date: 07/06/2019 CLINICAL DATA:  Unwitnessed fall EXAM: CT HEAD WITHOUT CONTRAST TECHNIQUE: Contiguous axial images were obtained from the base of the skull through the vertex without intravenous contrast. COMPARISON:  12/27/2017 FINDINGS: Brain: There is no acute intracranial hemorrhage, mass-effect, or  edema. There is no extra-axial fluid collection. Interval right larger than left chronic infarctions right occipital lobe and left parietooccipital lobes with associated volume loss. Additional new small chronic infarct of the left cerebellum. Confluent areas of hypoattenuation in the supratentorial white matter are nonspecific but probably reflect moderate to advanced chronic microvascular ischemic changes with progression since 2017. Prominence of the ventricles and sulci reflects generalized parenchymal volume loss, which has also increased since 2017. Vascular: There is atherosclerotic calcification at the skull base. Skull: Calvarium is unremarkable. Sinuses/Orbits: No acute finding. Chronic deformity of the right orbital floor. Other: Mastoid air cells are clear. IMPRESSION: No evidence of acute intracranial injury. New chronic infarcts and progression of chronic microvascular ischemic changes and parenchymal volume loss as detailed above. Electronically Signed   By: Macy Mis M.D.   On: 07/06/2019 15:10      The results of significant diagnostics from this hospitalization (including imaging, microbiology, ancillary and laboratory) are listed below for reference.     Microbiology: Recent Results (from the past 240 hour(s))  Respiratory Panel by RT PCR (Flu A&B, Covid) - Nasopharyngeal Swab     Status: None   Collection Time: 07/06/19  8:11 PM   Specimen: Nasopharyngeal Swab  Result Value Ref Range Status  SARS Coronavirus 2 by RT PCR NEGATIVE NEGATIVE Final    Comment: (NOTE) SARS-CoV-2 target nucleic acids are NOT DETECTED. The SARS-CoV-2 RNA is generally detectable in upper respiratoy specimens during the acute phase of infection. The lowest concentration of SARS-CoV-2 viral copies this assay can detect is 131 copies/mL. A negative result does not preclude SARS-Cov-2 infection and should not be used as the sole basis for treatment or other patient management decisions. A negative  result may occur with  improper specimen collection/handling, submission of specimen other than nasopharyngeal swab, presence of viral mutation(s) within the areas targeted by this assay, and inadequate number of viral copies (<131 copies/mL). A negative result must be combined with clinical observations, patient history, and epidemiological information. The expected result is Negative. Fact Sheet for Patients:  https://www.moore.com/ Fact Sheet for Healthcare Providers:  https://www.young.biz/ This test is not yet ap proved or cleared by the [place] FDA and  has been authorized for detection and/or diagnosis of SARS-CoV-2 by FDA under an Emergency Use Authorization (EUA). This EUA will remain  in effect (meaning this test can be used) for the duration of the COVID-19 declaration under Section 564(b)(1) of the Act, 21 U.S.C. section 360bbb-3(b)(1), unless the authorization is terminated or revoked sooner.    Influenza A by PCR NEGATIVE NEGATIVE Final   Influenza B by PCR NEGATIVE NEGATIVE Final    Comment: (NOTE) The Xpert Xpress SARS-CoV-2/FLU/RSV assay is intended as an aid in  the diagnosis of influenza from Nasopharyngeal swab specimens and  should not be used as a sole basis for treatment. Nasal washings and  aspirates are unacceptable for Xpert Xpress SARS-CoV-2/FLU/RSV  testing. Fact Sheet for Patients: https://www.moore.com/ Fact Sheet for Healthcare Providers: https://www.young.biz/ This test is not yet approved or cleared by the [place] FDA and  has been authorized for detection and/or diagnosis of SARS-CoV-2 by  FDA under an Emergency Use Authorization (EUA). This EUA will remain  in effect (meaning this test can be used) for the duration of the  Covid-19 declaration under Section 564(b)(1) of the Act, 21  U.S.C. section 360bbb-3(b)(1), unless the authorization is  terminated or  revoked. Performed at [facility], [address] [place], [place] [address]   Blood culture (routine x 2)     Status: None (Preliminary result)   Collection Time: 07/06/19  8:11 PM   Specimen: BLOOD  Result Value Ref Range Status   Specimen Description BLOOD BLOOD RIGHT ARM  Final   Special Requests   Final    BOTTLES DRAWN AEROBIC AND ANAEROBIC Blood Culture adequate volume   Culture   Final    NO GROWTH 2 DAYS Performed at [facility], [address], [place], [place] [address]    Report Status PENDING  Incomplete  Blood culture (routine x 2)     Status: None (Preliminary result)   Collection Time: 07/06/19  8:11 PM   Specimen: BLOOD  Result Value Ref Range Status   Specimen Description BLOOD LEFT ANTECUBITAL  Final   Special Requests   Final    BOTTLES DRAWN AEROBIC AND ANAEROBIC Blood Culture adequate volume   Culture   Final    NO GROWTH 2 DAYS Performed at [facility], [address] [place], [place] [address]    Report Status PENDING  Incomplete     Labs: BNP (last 3 results) No results for input(s): BNP in the last 8760 hours. Basic Metabolic Panel: Recent Labs  Lab 07/06/19 1508 07/07/19 0244 07/07/19  0444 07/08/19 0449  NA 142 144  --  141  K 3.6 3.5  --  3.4*  CL 107 109  --  111  CO2 22 23  --  25  GLUCOSE 156* 111*  --  147*  BUN 44* 46*  --  37*  CREATININE 1.19* 0.99  --  0.91  CALCIUM 9.7 9.5  --  9.2  MG  --   --  2.3 2.1   Liver Function Tests: Recent Labs  Lab 07/06/19 1508 07/08/19 0449  AST 14* 10*  ALT 10 9  ALKPHOS 55 42  BILITOT 0.8 0.5  PROT 6.4* 4.9*  ALBUMIN 2.9* 2.2*   No results for input(s): LIPASE, AMYLASE in the last 168 hours. No results for input(s): AMMONIA in the last 168 hours. CBC: Recent Labs  Lab 07/06/19 1508 07/07/19 0444  WBC 8.8 8.9  NEUTROABS 7.8*  --   HGB 14.4 13.3  HCT 46.0 41.7  MCV 95.4 94.1  PLT 83* 78*   Cardiac Enzymes: No results for input(s): CKTOTAL,  CKMB, CKMBINDEX, TROPONINI in the last 168 hours. BNP: Invalid input(s): POCBNP CBG: No results for input(s): GLUCAP in the last 168 hours. D-Dimer No results for input(s): DDIMER in the last 72 hours. Hgb A1c No results for input(s): HGBA1C in the last 72 hours. Lipid Profile No results for input(s): CHOL, HDL, LDLCALC, TRIG, CHOLHDL, LDLDIRECT in the last 72 hours. Thyroid function studies Recent Labs    07/07/19 0444  TSH 1.644   Anemia work up No results for input(s): VITAMINB12, FOLATE, FERRITIN, TIBC, IRON, RETICCTPCT in the last 72 hours. Urinalysis    Component Value Date/Time   COLORURINE YELLOW (A) 07/07/2019 0504   APPEARANCEUR CLOUDY (A) 07/07/2019 0504   LABSPEC 1.027 07/07/2019 0504   PHURINE 6.0 07/07/2019 0504   GLUCOSEU NEGATIVE 07/07/2019 0504   HGBUR LARGE (A) 07/07/2019 0504   BILIRUBINUR NEGATIVE 07/07/2019 0504   KETONESUR 5 (A) 07/07/2019 0504   PROTEINUR 100 (A) 07/07/2019 0504   NITRITE NEGATIVE 07/07/2019 0504   LEUKOCYTESUR MODERATE (A) 07/07/2019 0504   Sepsis Labs Invalid input(s): PROCALCITONIN,  WBC,  LACTICIDVEN Microbiology Recent Results (from the past 240 hour(s))  Respiratory Panel by RT PCR (Flu A&B, Covid) - Nasopharyngeal Swab     Status: None   Collection Time: 07/06/19  8:11 PM   Specimen: Nasopharyngeal Swab  Result Value Ref Range Status   SARS Coronavirus 2 by RT PCR NEGATIVE NEGATIVE Final    Comment: (NOTE) SARS-CoV-2 target nucleic acids are NOT DETECTED. The SARS-CoV-2 RNA is generally detectable in upper respiratoy specimens during the acute phase of infection. The lowest concentration of SARS-CoV-2 viral copies this assay can detect is 131 copies/mL. A negative result does not preclude SARS-Cov-2 infection and should not be used as the sole basis for treatment or other patient management decisions. A negative result may occur with  improper specimen collection/handling, submission of specimen other than  nasopharyngeal swab, presence of viral mutation(s) within the areas targeted by this assay, and inadequate number of viral copies (<131 copies/mL). A negative result must be combined with clinical observations, patient history, and epidemiological information. The expected result is Negative. Fact Sheet for Patients:  https://www.moore.com/ Fact Sheet for Healthcare Providers:  https://www.young.biz/ This test is not yet ap proved or cleared by the [place] FDA and  has been authorized for detection and/or diagnosis of SARS-CoV-2 by FDA under an Emergency Use Authorization (EUA). This EUA will remain  in effect (meaning  this test can be used) for the duration of the COVID-19 declaration under Section 564(b)(1) of the Act, 21 U.S.C. section 360bbb-3(b)(1), unless the authorization is terminated or revoked sooner.    Influenza A by PCR NEGATIVE NEGATIVE Final   Influenza B by PCR NEGATIVE NEGATIVE Final    Comment: (NOTE) The Xpert Xpress SARS-CoV-2/FLU/RSV assay is intended as an aid in  the diagnosis of influenza from Nasopharyngeal swab specimens and  should not be used as a sole basis for treatment. Nasal washings and  aspirates are unacceptable for Xpert Xpress SARS-CoV-2/FLU/RSV  testing. Fact Sheet for Patients: https://www.moore.com/ Fact Sheet for Healthcare Providers: https://www.young.biz/ This test is not yet approved or cleared by the [place] FDA and  has been authorized for detection and/or diagnosis of SARS-CoV-2 by  FDA under an Emergency Use Authorization (EUA). This EUA will remain  in effect (meaning this test can be used) for the duration of the  Covid-19 declaration under Section 564(b)(1) of the Act, 21  U.S.C. section 360bbb-3(b)(1), unless the authorization is  terminated or revoked. Performed at [facility], [address] [place], [place] [address]   Blood  culture (routine x 2)     Status: None (Preliminary result)   Collection Time: 07/06/19  8:11 PM   Specimen: BLOOD  Result Value Ref Range Status   Specimen Description BLOOD BLOOD RIGHT ARM  Final   Special Requests   Final    BOTTLES DRAWN AEROBIC AND ANAEROBIC Blood Culture adequate volume   Culture   Final    NO GROWTH 2 DAYS Performed at [facility], [address], [place], [place] [address]    Report Status PENDING  Incomplete  Blood culture (routine x 2)     Status: None (Preliminary result)   Collection Time: 07/06/19  8:11 PM   Specimen: BLOOD  Result Value Ref Range Status   Specimen Description BLOOD LEFT ANTECUBITAL  Final   Special Requests   Final    BOTTLES DRAWN AEROBIC AND ANAEROBIC Blood Culture adequate volume   Culture   Final    NO GROWTH 2 DAYS Performed at [facility], [address] [place], [place] [address]    Report Status PENDING  Incomplete     Time coordinating discharge:  I have spent 35 minutes face to face with the patient and on the ward discussing the patients care, assessment, plan and disposition with other care givers. >50% of the time was devoted counseling the patient about the risks and benefits of treatment/Discharge disposition and coordinating care.   SIGNED:   Dimple Nanas, MD  [facility] 07/08/2019, 10:21 AM   If 7PM-7AM, please contact night-coverage

## 2019-07-08 NOTE — Progress Notes (Signed)
Heart rate was 144bpm. Pt was given Metoprolol 5mg /100ml IV as ordered by On Call NP Bl[place]. Oncoming shift nurse informed of Q70minutes  orders for the Metoprolol.

## 2019-07-11 LAB — CULTURE, BLOOD (ROUTINE X 2)
Culture: NO GROWTH
Culture: NO GROWTH
Special Requests: ADEQUATE
Special Requests: ADEQUATE

## 2019-07-11 LAB — MAG IGM ANTIBODIES: MAG IgM Antibodies: <900 BTU (ref 0–999)

## 2019-07-11 LAB — MAG INTERPRETATION REFLEXED

## 2019-07-13 ENCOUNTER — Other Ambulatory Visit: Payer: Self-pay

## 2019-07-13 ENCOUNTER — Non-Acute Institutional Stay: Payer: Medicare Other | Admitting: Primary Care

## 2019-07-13 DIAGNOSIS — Z515 Encounter for palliative care: Secondary | ICD-10-CM

## 2019-07-13 NOTE — Progress Notes (Signed)
[facility] Pall[facility] Consult Note Telephone: [phone]  Fax: [phone]  TELEHEALTH VISIT STATEMENT Due to the COVID-19 crisis, this visit was done via telemedicine from my office. It was initiated and consented to by this patient and/or family.  PATIENT NAME: Sherry Crosby [phone] (home)  DOB: 05-05-1929 MRN: [id number]  PRIMARY CARE PROVIDER:   Marisa Hua, MD, [place] [address] [phone]  REFERRING PROVIDER:  Marisa Hua, MD [place],  [place] [address] [phone]  RESPONSIBLE PARTY:   Extended Emergency Contact Information Primary Emergency Contact: Napier,Nita [place] Phone: [phone] Relation: Other Secondary Emergency Contact: Rondel Oh [place] Phone: [phone] Relation: Son  Patient returned to SNF after hospitalized for fall and subsequent laceration. This is my first meeting with her since the fall. She is agitated, sitting in a chair with a meal tray. She is refusing to eat.  ASSESSMENT AND RECOMMENDATIONS:   1. Advance Care Planning/Goals of Care: Goals include to maximize quality of life and symptom management. DNR on file.  2. Symptom Management:   Nutrition: Has been refusing to eat, has puree diet with thin liquids. She repeatedly states she does not want the food. Staff states little intake. Hospital states 100 lbs but staff states less. Recommend current weight be obtained for diagnostic purposes.  Falls: She fell in early Jan and sustained a laceration, going to ED. She has her bed on the low setting but is in a chair today. No more falls reported but she has a history of many.  Frequent falls are highly correlated with decline.  Decline: Patient has been in a state of decline for some time but currently is not eating. Due to low albumin and refusing to eat I would suggest a hospice assessment  at this time.  3. Family /Caregiver/Community Supports: Family lives  in area, lives in [place].  4. Cognitive / Functional decline: Alert, oriented x 1. Will not follow commands, not eating, dependent in all adls.  5. Follow up Palliative Care Visit: Palliative care will continue to follow for goals of care clarification and symptom management. Return 2 weeks or prn. Recommend hospice assessment.  I spent 25 minutes providing this consultation,  from 1100 to 1125. More than 50% of the time in this consultation was spent coordinating communication.   HISTORY OF PRESENT ILLNESS:  Sherry Crosby is a 86 y.o. year old female with multiple medical problems including protein calorie malnutrition, abnormal weight loss, dementia, frequent falls. Palliative Care was asked to follow this patient by consultation request of Marisa Hua, MD to help address advance care planning and goals of care. This is a follow up visit.  CODE STATUS: DNR PPS: 30% HOSPICE ELIGIBILITY/DIAGNOSIS: yes.abnormal weight loss  PAST MEDICAL HISTORY:  Past Medical History:  Diagnosis Date  . A-fib ([place])   . Anxiety   . Depression   . High cholesterol   . Hx of degenerative disc disease   . Recurrent UTI   . Seizures ([facility])     SOCIAL HX:  Social History   Tobacco Use  . Smoking status: Never Smoker  . Smokeless tobacco: Never Used  Substance Use Topics  . Alcohol use: No    ALLERGIES: No Known Allergies   PERTINENT MEDICATIONS:  Outpatient Encounter Medications as of 07/13/2019  Medication Sig  . amiodarone (PACERONE) 100 MG tablet Take 100 mg by mouth daily.   Marland Kitchen  amoxicillin-clavulanate (AUGMENTIN) 400-57 MG/5ML suspension Take 10.9 mLs (875 mg total) by mouth every 12 (twelve) hours for 5 days.  . divalproex (DEPAKOTE) 125 MG DR tablet Take 125 mg by mouth 3 (three) times daily.  Marland Kitchen levETIRAcetam (KEPPRA) 100 MG/ML solution Take 1,000 mg by mouth 2 (two) times daily.  Marland Kitchen LORazepam (ATIVAN) 0.5 MG tablet  Take 0.5 mg by mouth 2 (two) times daily.  . metoprolol succinate (TOPROL-XL) 25 MG 24 hr tablet Take 0.5 tablets (12.5 mg total) by mouth daily.  . Vitamin D3 (VITAMIN D) 25 MCG tablet Take 1 tablet (1,000 Units total) by mouth daily.  Marland Kitchen zonisamide (ZONEGRAN) 100 MG capsule Take 100 mg by mouth daily.    No facility-administered encounter medications on file as of 07/13/2019.    PHYSICAL EXAM / ROS:   Current and past weights: staff reports loss, hospital chart = 45.4 kg, on hospital chart but staff states 79 lbs.  Albumin 2.2 on recent hospital stay General: NAD, frail appearing, thin and cachectic.Not eating Cardiovascular: no chest pain reported, no edema reported,  History of a fib, no anticoagulation due to fall risks Pulmonary: no cough, no increased SOB, room air Abdomen: appetite very poor, denies constipation, incontinent of bowel GU: denies dysuria, incontinent of urine MSK:  no joint deformities, non ambulatory. In chair today Skin: no rashes or wounds reported Neurological: Weakness, dementia stage 7 A, agitated, not able to report pain.   Sherry Crosby, Sherry Crosby

## 2019-07-14 ENCOUNTER — Telehealth: Payer: Self-pay | Admitting: Primary Care

## 2019-07-14 NOTE — Telephone Encounter (Signed)
T/c to POA to discuss hospice recommendation. I explained Sherry Crosby's decline and indication for hospice services for supportive care. Sherry Crosby states they have covid in the home and are on 14 days of isolation. She would like Sherry Crosby to have hospice services to address symptoms and for supportive care. Emails sent to MD and SNF director with recommendations, and will f/u next week on referral.

## 2019-11-28 DEATH — deceased

## 2020-08-12 IMAGING — DX DG CHEST 1V
1 series · 1 of 1 positions shown · non-contrast
Comparison: 12/27/2017 chest radiograph.

CLINICAL DATA: Altered mental status

EXAM:
CHEST  1 VIEW

[chest ap]
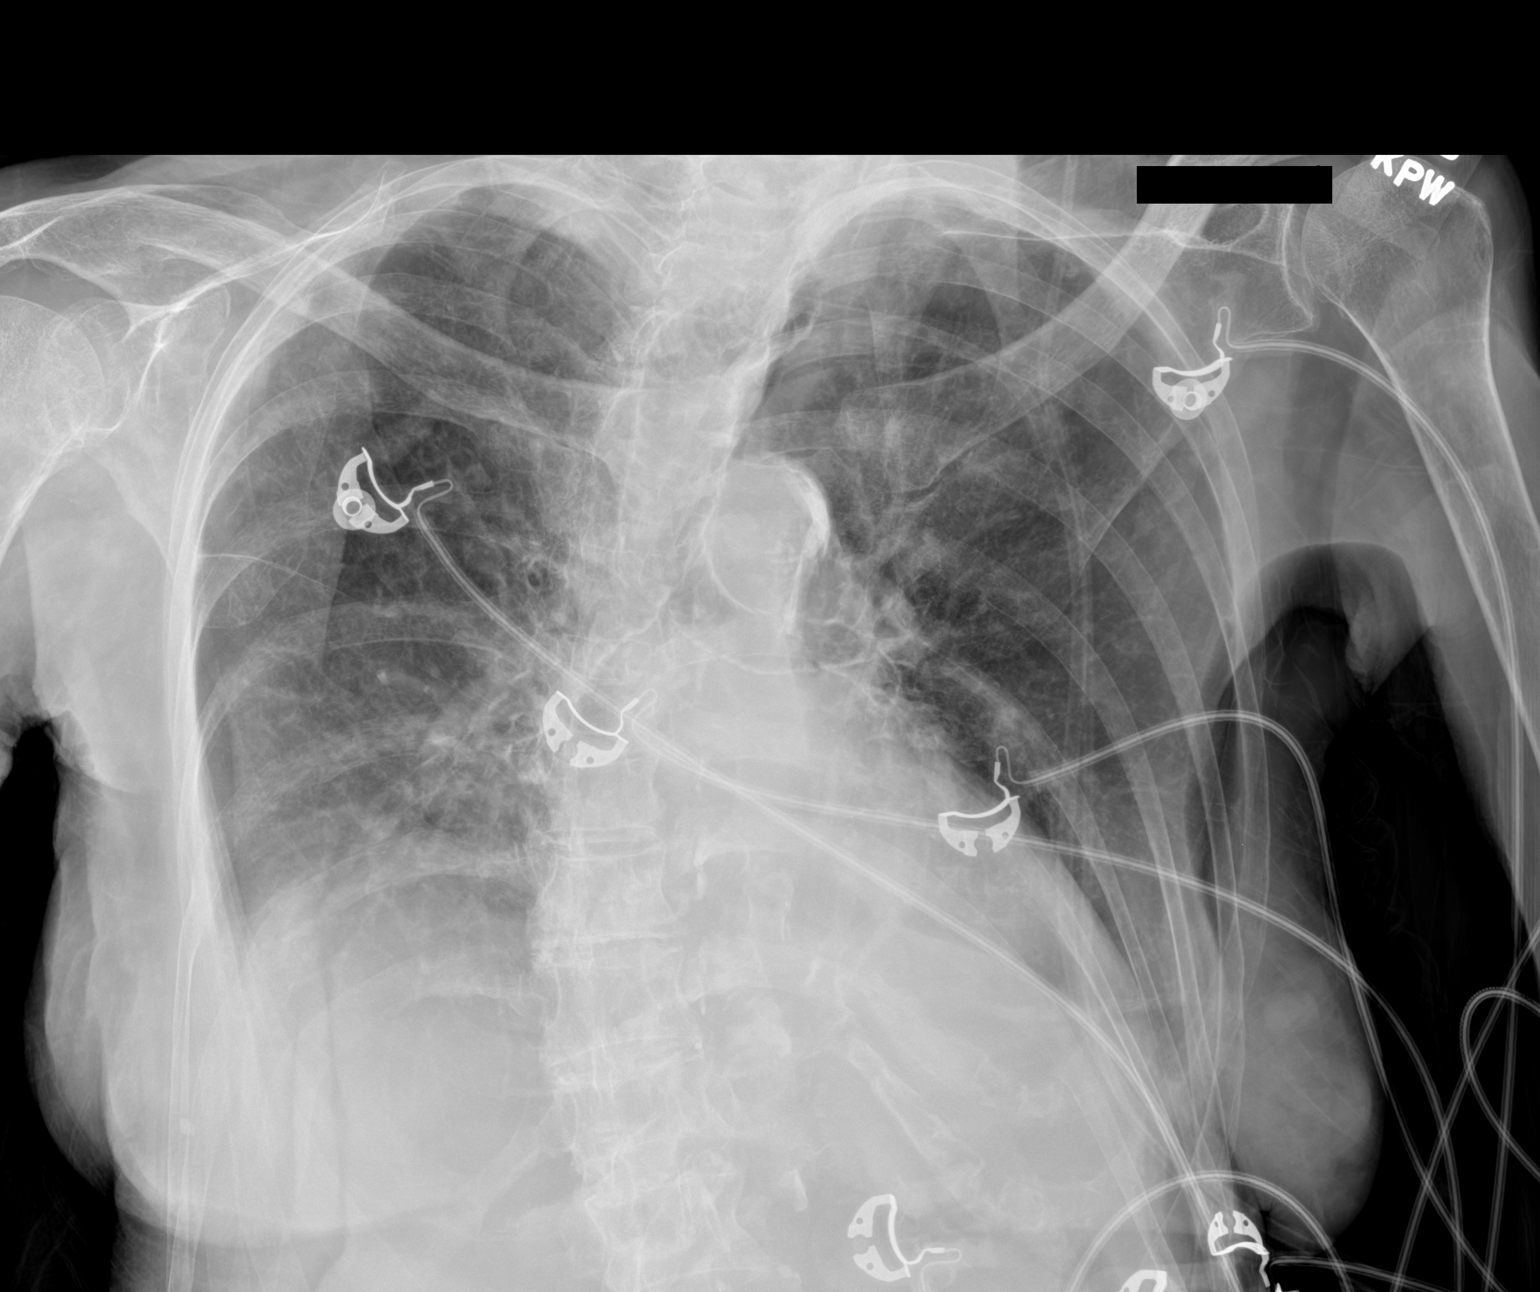

[1 of 1 positions shown; findings below may reference images not displayed]

FINDINGS: Left rotated chest radiograph. Stable cardiomediastinal silhouette
with normal heart size. No pneumothorax. Small bilateral pleural
effusions. No overt pulmonary edema. Hazy bibasilar lung opacities.
IMPRESSION: 1. Small bilateral pleural effusions.
2. Hazy bibasilar lung opacities, favor atelectasis, difficult to
exclude a component of aspiration or pneumonia.

## 2020-08-12 IMAGING — CT CT HEAD W/O CM
3 series · 15 of 46 positions shown, 18 images · non-contrast
Comparison: 12/27/2017

CLINICAL DATA: Unwitnessed fall

EXAM:
CT HEAD WITHOUT CONTRAST
TECHNIQUE: Contiguous axial images were obtained from the base of the skull
through the vertex without intravenous contrast.

[Series 2: head wo · axial · 0.42mm/px · z∈[+429,+549]mm · 9 of 29 slices shown, 12 images]
[im 3/29  brain]
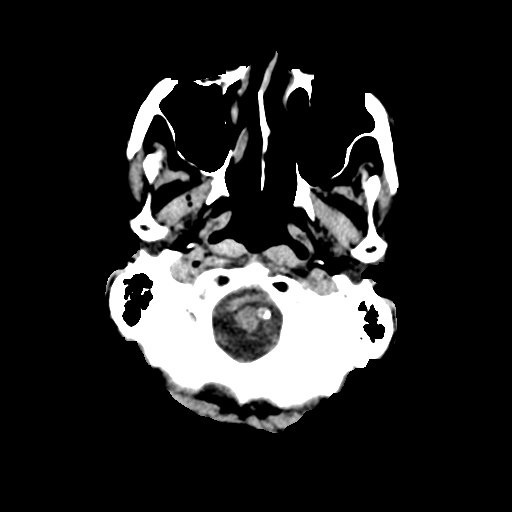
[im 3/29  bone]
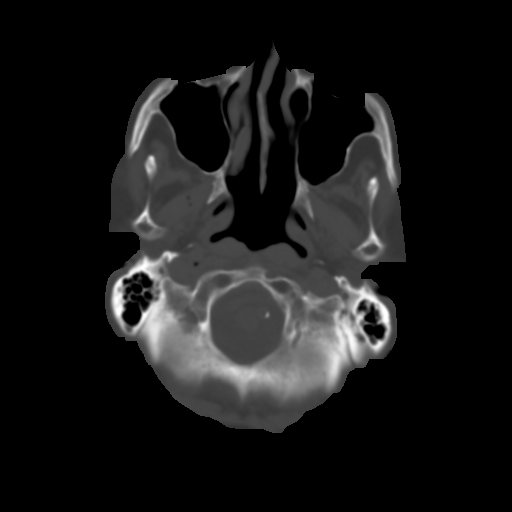
[im 6/29  brain]
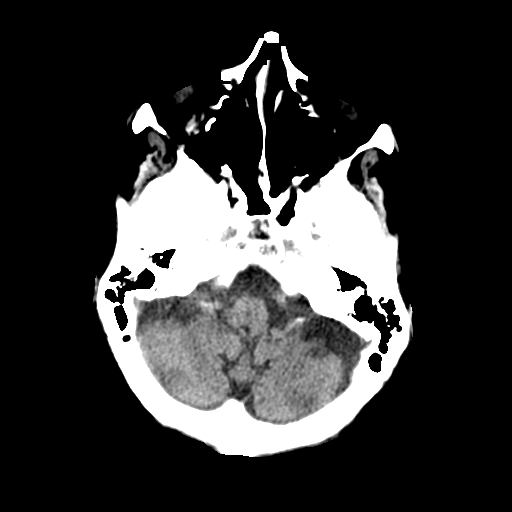
[im 9/29  brain]
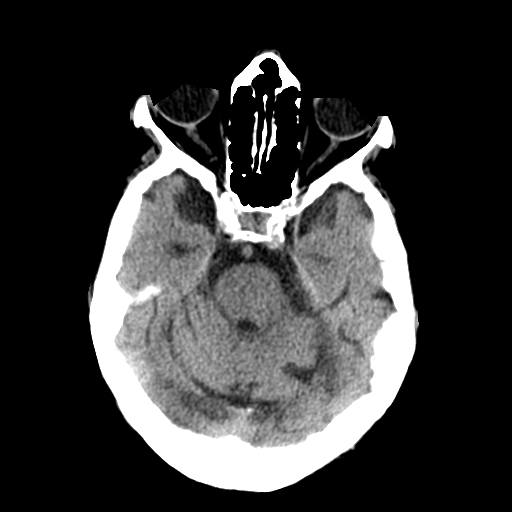
[im 12/29  brain]
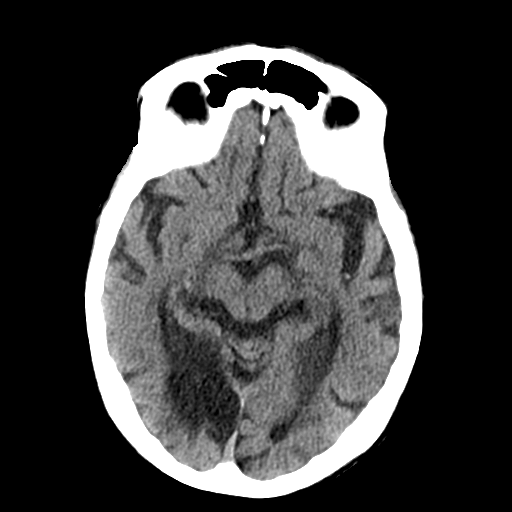
[im 15/29  brain]
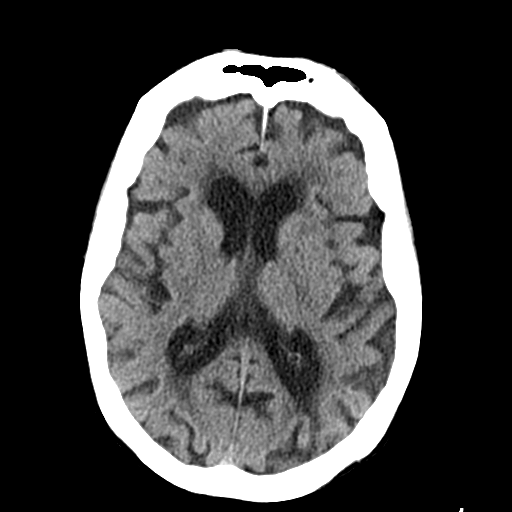
[im 15/29  bone]
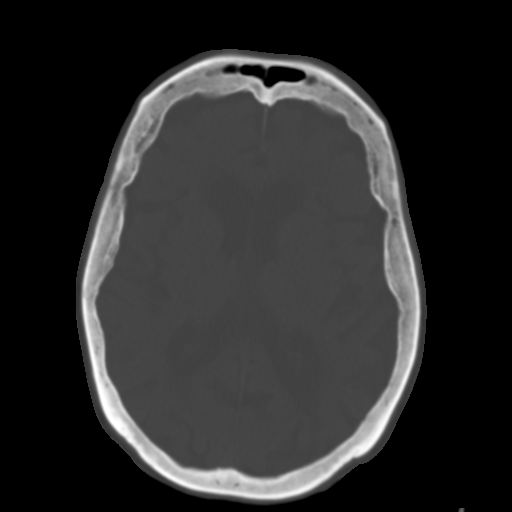
[im 18/29  brain]
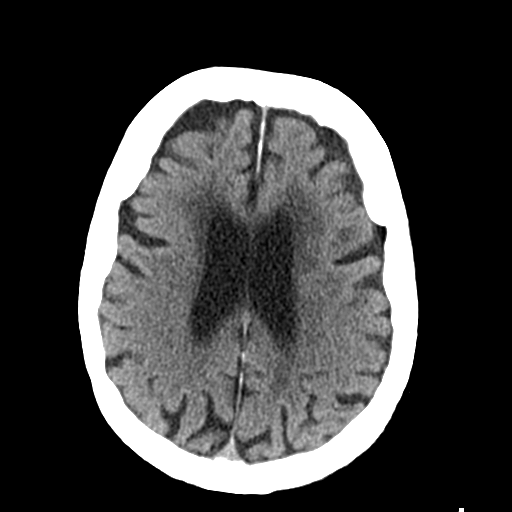
[im 21/29  brain]
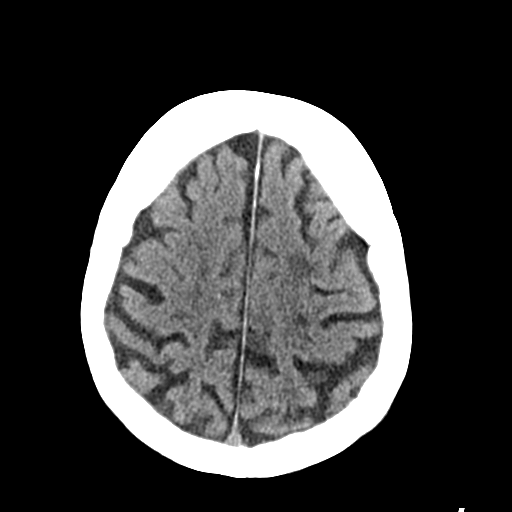
[im 24/29  brain]
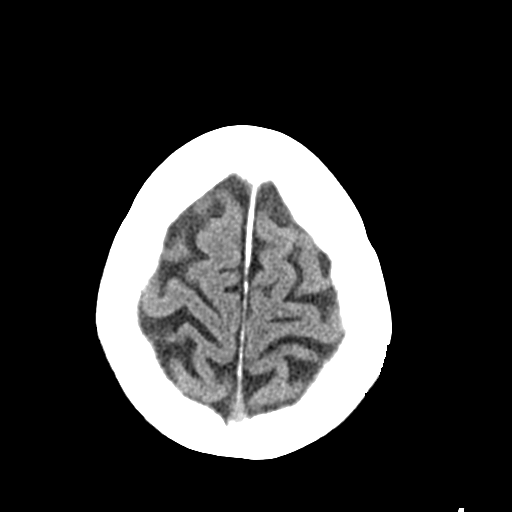
[im 27/29  brain]
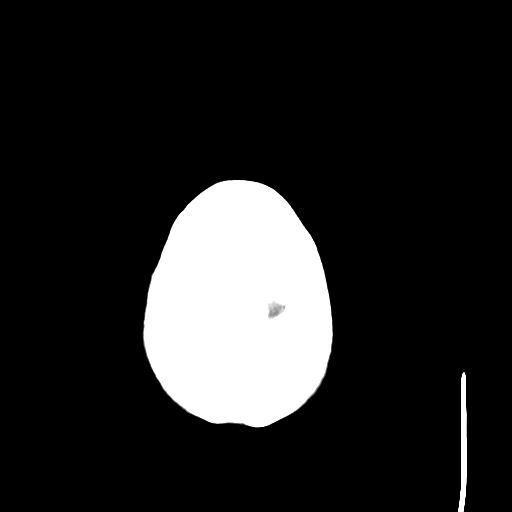
[im 27/29  bone]
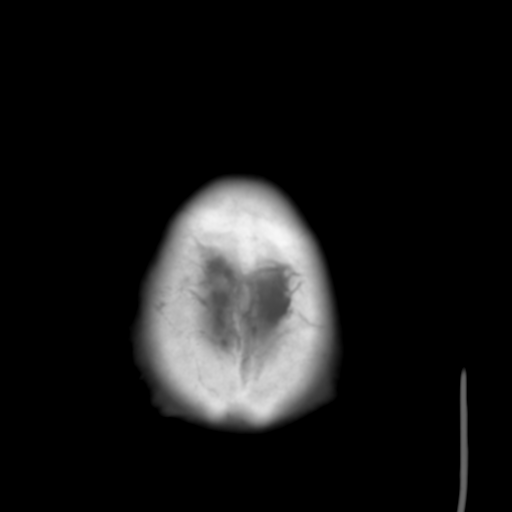

[Series 4: coronal soft tissue · coronal · 0.29mm/px · 3 of 68 slices shown]
[im 23/68  brain]
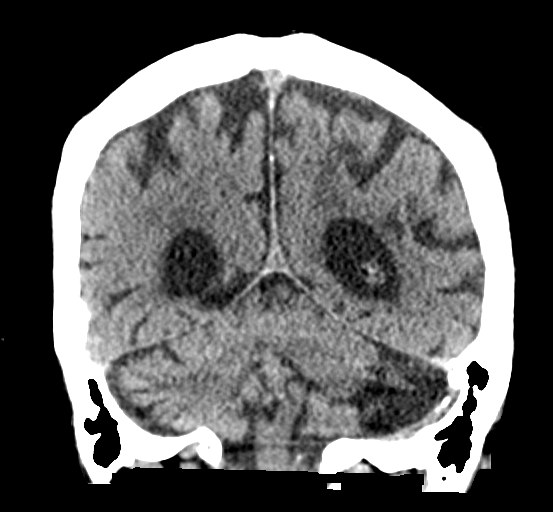
[im 30/68  brain]
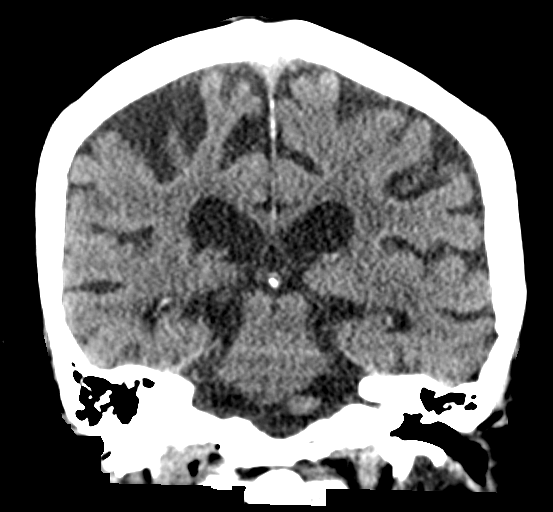
[im 38/68  brain]
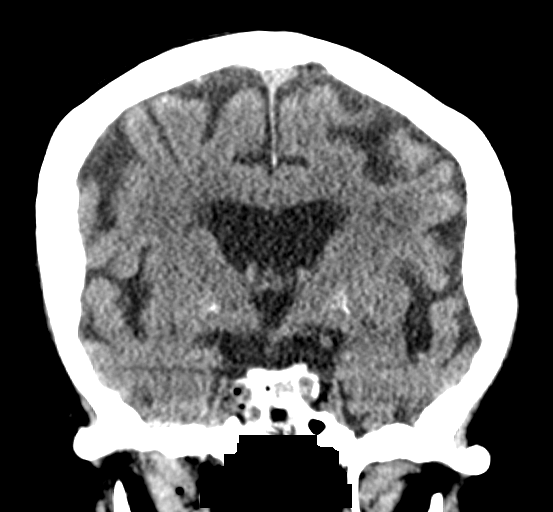

[Series 5: sagittal soft tissue · sagittal · 0.29mm/px · 3 of 53 slices shown]
[im 18/53  brain]
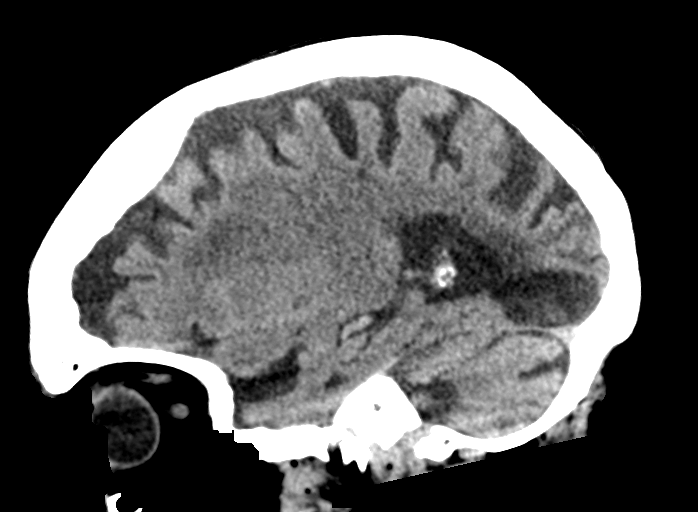
[im 27/53  brain]
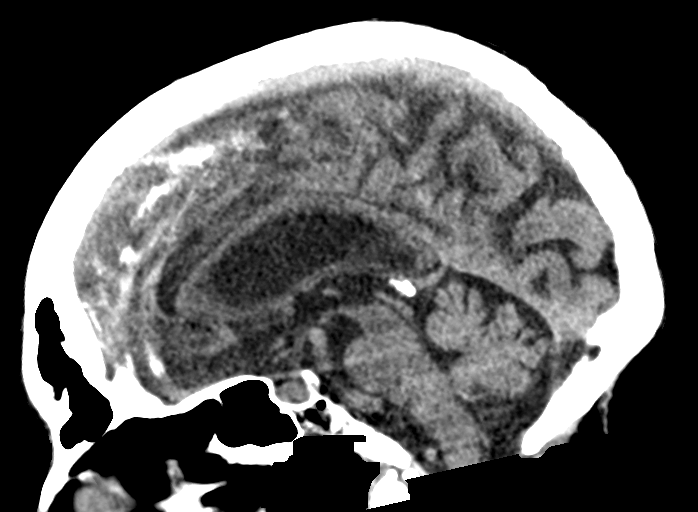
[im 35/53  brain]
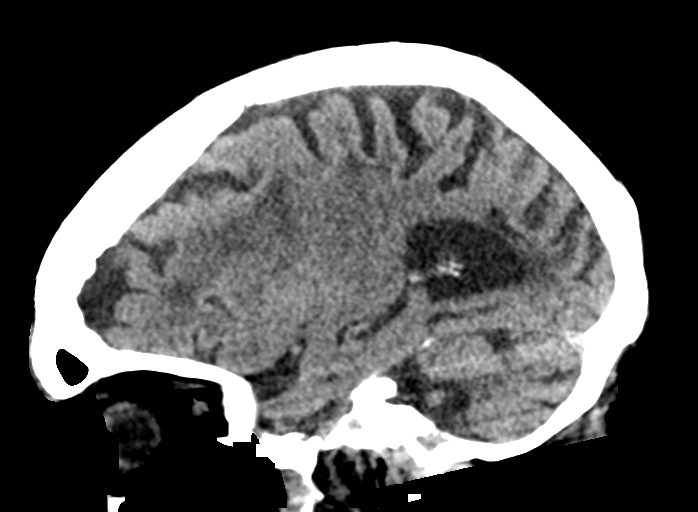

[15 of 46 positions shown; findings below may reference images not displayed]

FINDINGS: Brain: There is no acute intracranial hemorrhage, mass-effect, or
edema. There is no extra-axial fluid collection.

Interval right larger than left chronic infarctions right occipital
lobe and left parietooccipital lobes with associated volume loss.
Additional new small chronic infarct of the left cerebellum.
Confluent areas of hypoattenuation in the supratentorial white
matter are nonspecific but probably reflect moderate to advanced
chronic microvascular ischemic changes with progression since 8844.
Prominence of the ventricles and sulci reflects generalized
parenchymal volume loss, which has also increased since 8844.

Vascular: There is atherosclerotic calcification at the skull base.

Skull: Calvarium is unremarkable.

Sinuses/Orbits: No acute finding. Chronic deformity of the right
orbital floor.

Other: Mastoid air cells are clear.
IMPRESSION: No evidence of acute intracranial injury.

New chronic infarcts and progression of chronic microvascular
ischemic changes and parenchymal volume loss as detailed above.
